# Patient Record
Sex: Male | Born: 1960 | Race: Black or African American | Hispanic: No
Health system: Southern US, Community
[De-identification: ages and names within clinical notes are randomized; demographics above are authoritative.]

## PROBLEM LIST (undated history)

## (undated) DIAGNOSIS — S3992XA Unspecified injury of lower back, initial encounter: Secondary | ICD-10-CM

## (undated) DIAGNOSIS — S199XXA Unspecified injury of neck, initial encounter: Secondary | ICD-10-CM

## (undated) DIAGNOSIS — F329 Major depressive disorder, single episode, unspecified: Secondary | ICD-10-CM

## (undated) DIAGNOSIS — G8929 Other chronic pain: Secondary | ICD-10-CM

## (undated) DIAGNOSIS — F4310 Post-traumatic stress disorder, unspecified: Secondary | ICD-10-CM

## (undated) DIAGNOSIS — M542 Cervicalgia: Secondary | ICD-10-CM

## (undated) DIAGNOSIS — R42 Dizziness and giddiness: Secondary | ICD-10-CM

## (undated) DIAGNOSIS — M549 Dorsalgia, unspecified: Secondary | ICD-10-CM

## (undated) DIAGNOSIS — F32A Depression, unspecified: Secondary | ICD-10-CM

---

## 2000-09-29 ENCOUNTER — Emergency Department (HOSPITAL_COMMUNITY): Admission: EM | Admit: 2000-09-29 | Discharge: 2000-09-30 | Payer: Self-pay | Admitting: *Deleted

## 2001-10-05 ENCOUNTER — Emergency Department (HOSPITAL_COMMUNITY): Admission: EM | Admit: 2001-10-05 | Discharge: 2001-10-05 | Payer: Self-pay | Admitting: Emergency Medicine

## 2003-07-19 ENCOUNTER — Emergency Department (HOSPITAL_COMMUNITY): Admission: EM | Admit: 2003-07-19 | Discharge: 2003-07-19 | Payer: Self-pay | Admitting: Internal Medicine

## 2003-12-09 ENCOUNTER — Emergency Department (HOSPITAL_COMMUNITY): Admission: EM | Admit: 2003-12-09 | Discharge: 2003-12-09 | Payer: Self-pay | Admitting: Emergency Medicine

## 2003-12-24 ENCOUNTER — Emergency Department (HOSPITAL_COMMUNITY): Admission: EM | Admit: 2003-12-24 | Discharge: 2003-12-24 | Payer: Self-pay | Admitting: Emergency Medicine

## 2009-05-11 ENCOUNTER — Ambulatory Visit: Payer: Self-pay | Admitting: Family Medicine

## 2009-05-11 ENCOUNTER — Telehealth (INDEPENDENT_AMBULATORY_CARE_PROVIDER_SITE_OTHER): Payer: Self-pay | Admitting: *Deleted

## 2009-05-15 ENCOUNTER — Ambulatory Visit: Payer: Self-pay | Admitting: Family Medicine

## 2010-06-04 NOTE — Progress Notes (Signed)
Summary: triage/Cold congestion  Phone Note Call from Patient   Caller: Patient Reason for Call: Talk to Nurse Summary of Call: Patient walked in at front desk.Marland KitchenMarland KitchenHe is staying at [facility] and feels he has a fever and chills...cough and congestion...given a mask for Droplet Precautions and put in Acute schedule. May need a note for rest to stay inside.. Initial call taken by: Conchita Paris,  May 11, 2009 10:08 AM

## 2011-01-10 ENCOUNTER — Emergency Department (HOSPITAL_COMMUNITY)
Admission: EM | Admit: 2011-01-10 | Discharge: 2011-01-10 | Disposition: A | Discharge status: Home / Self Care | Payer: Self-pay | Attending: Emergency Medicine | Admitting: Emergency Medicine

## 2011-01-10 ENCOUNTER — Encounter: Payer: Self-pay | Admitting: *Deleted

## 2011-01-10 ENCOUNTER — Emergency Department (HOSPITAL_COMMUNITY): Payer: Self-pay

## 2011-01-10 DIAGNOSIS — W11XXXA Fall on and from ladder, initial encounter: Secondary | ICD-10-CM | POA: Insufficient documentation

## 2011-01-10 DIAGNOSIS — S20229A Contusion of unspecified back wall of thorax, initial encounter: Secondary | ICD-10-CM | POA: Insufficient documentation

## 2011-01-10 DIAGNOSIS — Y9269 Other specified industrial and construction area as the place of occurrence of the external cause: Secondary | ICD-10-CM | POA: Insufficient documentation

## 2011-01-10 LAB — URINALYSIS, ROUTINE W REFLEX MICROSCOPIC
Glucose, UA: NEGATIVE mg/dL
Ketones, ur: NEGATIVE mg/dL
Leukocytes, UA: NEGATIVE
Protein, ur: NEGATIVE mg/dL

## 2011-01-10 MED ORDER — IBUPROFEN 800 MG PO TABS
800.0000 mg | ORAL_TABLET | Freq: Once | ORAL | Status: AC
  Administered 2011-01-10: 800 mg via ORAL
  Filled 2011-01-10: qty 1

## 2011-01-10 MED ORDER — HYDROCODONE-ACETAMINOPHEN 5-325 MG PO TABS
1.0000 | ORAL_TABLET | Freq: Once | ORAL | Status: AC
  Administered 2011-01-10: 1 via ORAL
  Filled 2011-01-10: qty 1

## 2011-01-10 MED ORDER — HYDROCODONE-ACETAMINOPHEN 5-325 MG PO TABS: ORAL_TABLET | ORAL | Status: DC

## 2011-01-10 NOTE — ED Notes (Signed)
Reports hx of chronic back pain; states fell approx 4 ft yesterday from ladder while working, landing sideways; c/o mid to lower back pain that radiates to chest, and c/o a "popping" sensation in midback; reports pain from lower back is radiating down LLE.

## 2011-01-10 NOTE — ED Provider Notes (Signed)
History     CSN: [id number] Arrival date & time: 01/10/2011  9:57 AM  Chief Complaint  Patient presents with  . Back Pain  . Fall   HPI Comments: Ethan Moore 4 feet from a ladder and landed on his back.  Patient is a 50 y.o. male presenting with back pain and fall. The history is provided by the patient. No language interpreter was used.  Back Pain  This is a new problem. The current episode started yesterday. The problem occurs constantly. The problem has been gradually worsening. The pain is associated with falling. The pain is present in the lumbar spine. The quality of the pain is described as stabbing. The pain radiates to the left thigh. The pain is at a severity of 8/10. The pain is severe. The symptoms are aggravated by bending and twisting. Associated symptoms include numbness and paresthesias. He has tried NSAIDs for the symptoms. The treatment provided no relief.  Fall Associated symptoms include numbness.    History reviewed. No pertinent past medical history.  History reviewed. No pertinent past surgical history.  No family history on file.  History  Substance Use Topics  . Smoking status: Never Smoker   . Smokeless tobacco: Not on file  . Alcohol Use: No      Review of Systems  Musculoskeletal: Positive for back pain.  Neurological: Positive for numbness and paresthesias.  All other systems reviewed and are negative.    Physical Exam  BP 127/79  Pulse 65  Temp(Src) 98 F (36.7 C) (Oral)  Resp 18  Ht 6\' 4"  (1.93 m)  Wt 255 lb (115.667 kg)  BMI 31.04 kg/m2  SpO2 100%  Physical Exam  Nursing note and vitals reviewed. Constitutional: He is oriented to person, place, and time. Vital signs are normal. He appears well-developed and well-nourished. No distress.  HENT:  Head: Normocephalic and atraumatic.  Right Ear: External ear normal.  Left Ear: External ear normal.  Nose: Nose normal.  Mouth/Throat: No oropharyngeal exudate.  Eyes: Conjunctivae and EOM are  normal. Pupils are equal, round, and reactive to light. Right eye exhibits no discharge. Left eye exhibits no discharge. No scleral icterus.  Neck: Normal range of motion. Neck supple. No JVD present. No tracheal deviation present. No thyromegaly present.  Cardiovascular: Normal rate, regular rhythm, normal heart sounds, intact distal pulses and normal pulses.  Exam reveals no gallop and no friction rub.   No murmur heard. Pulmonary/Chest: Effort normal and breath sounds normal. No stridor. No respiratory distress. He has no wheezes. He has no rales. He exhibits no tenderness.  Abdominal: Soft. Normal appearance and bowel sounds are normal. He exhibits no distension and no mass. There is no tenderness. There is no rebound and no guarding.  Musculoskeletal: Normal range of motion. He exhibits tenderness. He exhibits no edema.       Back:  Lymphadenopathy:    He has no cervical adenopathy.  Neurological: He is alert and oriented to person, place, and time. He has normal strength and normal reflexes. No cranial nerve deficit or sensory deficit. Coordination normal. GCS eye subscore is 4. GCS verbal subscore is 5. GCS motor subscore is 6.  Reflex Scores:      Patellar reflexes are 2+ on the right side and 2+ on the left side.      Achilles reflexes are 2+ on the right side and 2+ on the left side. Skin: Skin is warm and dry. No rash noted. He is not diaphoretic.  Psychiatric: He  has a normal mood and affect. His speech is normal and behavior is normal. Judgment and thought content normal. Cognition and memory are normal.    ED Course  Procedures  MDM       Worthy Rancher, PA 01/10/11 1340

## 2011-01-11 NOTE — ED Provider Notes (Signed)
Medical screening examination/treatment/procedure(s) were performed by non-physician practitioner and as supervising physician I was immediately available for consultation/collaboration.   Melva Faux M Teila Skalsky, DO 01/11/11 1444 

## 2013-03-08 ENCOUNTER — Encounter (HOSPITAL_COMMUNITY): Payer: Self-pay | Admitting: Emergency Medicine

## 2013-03-08 ENCOUNTER — Emergency Department (HOSPITAL_COMMUNITY): Payer: Non-veteran care

## 2013-03-08 ENCOUNTER — Emergency Department (HOSPITAL_COMMUNITY)
Admission: EM | Admit: 2013-03-08 | Discharge: 2013-03-08 | Discharge status: Against Medical Advice | Payer: Non-veteran care | Attending: Emergency Medicine | Admitting: Emergency Medicine

## 2013-03-08 DIAGNOSIS — Y929 Unspecified place or not applicable: Secondary | ICD-10-CM | POA: Insufficient documentation

## 2013-03-08 DIAGNOSIS — Y9389 Activity, other specified: Secondary | ICD-10-CM | POA: Insufficient documentation

## 2013-03-08 DIAGNOSIS — W19XXXA Unspecified fall, initial encounter: Secondary | ICD-10-CM

## 2013-03-08 DIAGNOSIS — Z87828 Personal history of other (healed) physical injury and trauma: Secondary | ICD-10-CM | POA: Insufficient documentation

## 2013-03-08 DIAGNOSIS — IMO0002 Reserved for concepts with insufficient information to code with codable children: Secondary | ICD-10-CM | POA: Insufficient documentation

## 2013-03-08 DIAGNOSIS — M549 Dorsalgia, unspecified: Secondary | ICD-10-CM

## 2013-03-08 DIAGNOSIS — R29898 Other symptoms and signs involving the musculoskeletal system: Secondary | ICD-10-CM

## 2013-03-08 DIAGNOSIS — W11XXXA Fall on and from ladder, initial encounter: Secondary | ICD-10-CM | POA: Insufficient documentation

## 2013-03-08 HISTORY — DX: Unspecified injury of lower back, initial encounter: S39.92XA

## 2013-03-08 LAB — BASIC METABOLIC PANEL
Calcium: 8.6 mg/dL (ref 8.4–10.5)
Creatinine, Ser: 1.38 mg/dL — ABNORMAL HIGH (ref 0.50–1.35)
GFR calc Af Amer: 67 mL/min — ABNORMAL LOW (ref >90)
GFR calc non Af Amer: 58 mL/min — ABNORMAL LOW (ref >90)

## 2013-03-08 LAB — URINALYSIS, ROUTINE W REFLEX MICROSCOPIC
Ketones, ur: NEGATIVE mg/dL
Leukocytes, UA: NEGATIVE
Nitrite: NEGATIVE
Protein, ur: NEGATIVE mg/dL

## 2013-03-08 LAB — CBC WITH DIFFERENTIAL/PLATELET
Basophils Absolute: 0.1 10*3/uL (ref 0.0–0.1)
Basophils Relative: 1 % (ref 0–1)
Eosinophils Relative: 4 % (ref 0–5)
HCT: 41 % (ref 39.0–52.0)
MCHC: 33.7 g/dL (ref 30.0–36.0)
MCV: 92.3 fL (ref 78.0–100.0)
Monocytes Absolute: 0.7 10*3/uL (ref 0.1–1.0)
Neutro Abs: 5.9 10*3/uL (ref 1.7–7.7)
RDW: 13.4 % (ref 11.5–15.5)

## 2013-03-08 MED ORDER — ONDANSETRON HCL 4 MG/2ML IJ SOLN
4.0000 mg | Freq: Once | INTRAMUSCULAR | Status: DC
  Filled 2013-03-08: qty 2

## 2013-03-08 MED ORDER — IOHEXOL 300 MG/ML  SOLN
100.0000 mL | Freq: Once | INTRAMUSCULAR | Status: AC | PRN
  Administered 2013-03-08: 100 mL via INTRAVENOUS

## 2013-03-08 MED ORDER — MORPHINE SULFATE 4 MG/ML IJ SOLN
4.0000 mg | Freq: Once | INTRAMUSCULAR | Status: DC
  Filled 2013-03-08: qty 1

## 2013-03-08 MED ORDER — IBUPROFEN 800 MG PO TABS: 800.0000 mg | ORAL_TABLET | Freq: Three times a day (TID) | ORAL | Status: DC

## 2013-03-08 MED ORDER — OXYCODONE-ACETAMINOPHEN 5-325 MG PO TABS
2.0000 | ORAL_TABLET | Freq: Once | ORAL | Status: AC
  Administered 2013-03-08: 2 via ORAL
  Filled 2013-03-08: qty 2

## 2013-03-08 MED ORDER — HYDROCODONE-ACETAMINOPHEN 5-325 MG PO TABS: 2.0000 | ORAL_TABLET | ORAL | Status: DC | PRN

## 2013-03-08 MED ORDER — OXYCODONE-ACETAMINOPHEN 5-325 MG PO TABS: 2.0000 | ORAL_TABLET | ORAL | Status: DC | PRN

## 2013-03-08 NOTE — ED Notes (Signed)
Pt went over to MRI and was not tolerating scan and refused scan. MRI staff cancelled order for scan. EDP aware no new orders given.

## 2013-03-08 NOTE — ED Notes (Signed)
Pt states he was cleaning gutters and Saturday and fell off the ladder. Complain of pain in chest, back and left leg. States he was knock out for a while

## 2013-03-08 NOTE — ED Notes (Signed)
Pt. Tolerated ambulation well.

## 2013-03-08 NOTE — ED Notes (Addendum)
Pt refusing IV and morphine for pain. EDP aware.

## 2013-03-08 NOTE — ED Provider Notes (Signed)
CSN: [id number]     Arrival date & time 03/08/13  0919 History   This chart was scribed for Glynn Octave, MD, by Yevette Edwards, ED Scribe. This patient was seen in room APA16A/APA16A and the patient's care was started at 9:46 AM.  First MD Initiated Contact with Patient 03/08/13 0945     Chief Complaint  Patient presents with  . Fall   The history is provided by the patient. No language interpreter was used.   HPI Comments: Ethan Moore is a 52 y.o. male who presents to the Emergency Department complaining of a fall which occurred three days ago when he fell off of an 8 foot ladder and landed upon his back and left hip. The pt denies any head impact or LOC in the fall. He reports pain to his lower back, left hip, and he states the pain "shoots" down his left leg. He also reports weakness to his left leg. The pt states he has not been ambulating much since the fall. He denies any chest pain, emesis, dysuria, or hematuria. He reports he is experiencing pain to his lower sternum. He denies any h/o cardiac issues.m The pt has a h/o back issues.  The pt denies any known allergies.  He utilizes the [facility] for medical treatment. He used to receive steroid injections at the [facility] to his back.  Past Medical History  Diagnosis Date  . Back injury    History reviewed. No pertinent past surgical history. History reviewed. No pertinent family history. History  Substance Use Topics  . Smoking status: Never Smoker   . Smokeless tobacco: Not on file  . Alcohol Use: No    Review of Systems  Gastrointestinal: Negative for vomiting.  Genitourinary: Negative for dysuria and hematuria.  Musculoskeletal: Positive for back pain. Negative for neck pain.  All other systems reviewed and are negative.   Allergies  Review of patient's allergies indicates no known allergies.  Home Medications   Current Outpatient Rx  Name  Route  Sig  Dispense  Refill  . Multiple Vitamins-Minerals (MULTIVITAMINS THER.  W/MINERALS) TABS   Oral   Take 1 tablet by mouth daily.           Marland Kitchen HYDROcodone-acetaminophen (NORCO/VICODIN) 5-325 MG per tablet   Oral   Take 1 tablet by mouth every 6 (six) hours as needed for moderate pain.         Marland Kitchen ibuprofen (ADVIL,MOTRIN) 800 MG tablet   Oral   Take 1 tablet (800 mg total) by mouth 3 (three) times daily.   21 tablet   0   . oxyCODONE-acetaminophen (PERCOCET/ROXICET) 5-325 MG per tablet   Oral   Take 2 tablets by mouth every 4 (four) hours as needed for severe pain.   15 tablet   0    Triage Vitals: BP 125/76  Pulse 83  Temp(Src) 98.6 F (37 C) (Oral)  Resp 18  Ht 6\' 4"  (1.93 m)  Wt 260 lb (117.935 kg)  BMI 31.66 kg/m2  SpO2 99%  Physical Exam  Nursing note and vitals reviewed. Constitutional: He is oriented to person, place, and time. He appears well-developed and well-nourished. No distress.  HENT:  Head: Normocephalic and atraumatic.  Eyes: EOM are normal.  Neck: Neck supple. No tracheal deviation present.  Cardiovascular: Normal rate.   Pulmonary/Chest: Effort normal. No respiratory distress.  Abdominal: There is tenderness.  LUQ tenderness.   Musculoskeletal: Normal range of motion. He exhibits tenderness.  Tender in lumbar spine.  No C-spine tenderness.  No step-off or deformity.  Pelvis stable.  Tender to lower sternum.  4/5 strength in extension and flexion in the left. 5/5 extension and flexion in the right.  Ankle plantar and dorsiflexion intact.  Weak great toe extension on the left.  +2 DP and PT pulses. +2 patellar reflexes bilaterally. Normal gait.   Neurological: He is alert and oriented to person, place, and time. He displays normal reflexes. No cranial nerve deficit. He exhibits normal muscle tone. Coordination normal.  CN 2-12 intact, no ataxia on finger to nose, no nystagmus, 5/5 strength throughout, no pronator drift, Romberg negative, normal gait.   Skin: Skin is warm and dry.  Psychiatric: He has a normal mood  and affect. His behavior is normal.    ED Course  Procedures (including critical care time)  DIAGNOSTIC STUDIES: Oxygen Saturation is 99% on room air, normal by my interpretation.    COORDINATION OF CARE:  9:53 AM- Discussed treatment plan with patient, and the patient agreed to the plan.   11:54 AM- Rechecked pt.   1:11 PM- Rechecked pt.   Labs Review Labs Reviewed  BASIC METABOLIC PANEL - Abnormal; Notable for the following:    Creatinine, Ser 1.38 (*)    GFR calc non Af Amer 58 (*)    GFR calc Af Amer 67 (*)    All other components within normal limits  CBC WITH DIFFERENTIAL  URINALYSIS, ROUTINE W REFLEX MICROSCOPIC  TROPONIN I   Imaging Review Dg Chest 2 View  03/08/2013   CLINICAL DATA:  Chest pain. Fall  EXAM: CHEST  2 VIEW  COMPARISON:  None.  FINDINGS: The heart size and mediastinal contours are within normal limits. Both lungs are clear. The visualized skeletal structures are unremarkable.  IMPRESSION: No active cardiopulmonary disease.   Electronically Signed   By: Marlan Palau M.D.   On: 03/08/2013 10:49   Dg Lumbar Spine Complete  03/08/2013   CLINICAL DATA:  Back pain  EXAM: LUMBAR SPINE - COMPLETE 4+ VIEW  COMPARISON:  None.  FINDINGS: Vertebral body height is well maintained. No spondylolysis or spondylolisthesis is seen. No acute soft tissue abnormality is noted.  IMPRESSION: No acute abnormality noted.   Electronically Signed   By: Alcide Clever M.D.   On: 03/08/2013 10:49   Ct Head Wo Contrast  03/08/2013   CLINICAL DATA:  Fall 3 days ago  EXAM: CT HEAD WITHOUT CONTRAST  TECHNIQUE: Contiguous axial images were obtained from the base of the skull through the vertex without intravenous contrast.  COMPARISON:  None.  FINDINGS: No skull fracture is noted. Minimal scalp swelling in right posterior parietal scalp see axial image 14.  No intracranial hemorrhage, mass effect or midline shift.  No acute infarction. No hydrocephalus. The gray and white-matter  differentiation is preserved. No mass lesion is noted on this unenhanced scan.  IMPRESSION: No acute intracranial abnormality.   Electronically Signed   By: Natasha Mead M.D.   On: 03/08/2013 11:07   Ct Abdomen Pelvis W Contrast  03/08/2013   CLINICAL DATA:  Fall 3 days ago, back pain, left hip pain  EXAM: CT ABDOMEN AND PELVIS WITH CONTRAST  TECHNIQUE: Multidetector CT imaging of the abdomen and pelvis was performed using the standard protocol following bolus administration of intravenous contrast.  CONTRAST:  OMNIPAQUE IOHEXOL 300 MG/ML  SOLN  COMPARISON:  None.  FINDINGS: Sagittal images of the spine shows disc space flattening with vacuum disc phenomenon and mild anterior spurring  at L5-S1 level. Mild degenerative changes bilateral SI joints. No acute fractures are identified.  Lung bases are unremarkable. Enhanced liver, pancreas, spleen and adrenals are unremarkable. Enhanced kidneys are symmetrical in size. No calcified gallstones are noted within gallbladder. No aortic aneurysm. No small bowel obstruction. No thickened or dilated small bowel loops. No pericecal inflammation. Normal appendix is clearly visualize in axial image 75. No evidence of urinary bladder injury. Prostate gland and seminal vesicles are unremarkable. No inguinal adenopathy. No pelvic fractures are noted. Coronal images shows no hip fracture.  No ascites or free air. No adenopathy.  IMPRESSION: 1. No acute visceral injury within abdomen or pelvis. 2. No hydronephrosis or hydroureter. 3. No pericecal inflammation. Normal appendix. 4. No ascites or free air. 5. Degenerative changes lumbar spine at L5-S1 level.   Electronically Signed   By: Natasha Mead M.D.   On: 03/08/2013 11:03    EKG Interpretation     Ventricular Rate:  67 PR Interval:  212 QRS Duration: 96 QT Interval:  410 QTC Calculation: 433 R Axis:   91 Text Interpretation:  Sinus rhythm with 1st degree A-V block Rightward axis Borderline ECG No previous ECGs  available No previous ECGs available            MDM   1. Fall, initial encounter   2. Back pain   3. Leg weakness    Fall from 8 feet 3 days ago with back, abdominal pain and head pain.  Uncertain LOC.  L leg weakness and back pain today. No incontinence.  CT head and CT abdomen negative. L leg weakness persists but patient is able to ambulate. MRI of lumbar spine was ordered to evaluate for possible spinal cord injury.  Patient attempted study but was unable to tolerate 2/2 claustrophobia.  He declines medication or reattempt of MRI. He is ambulatory and wants to go home.  He understands that a spinal cord injury has not been ruled out and he is leaving against medical advice. He has capacity to make this decision and understands that he might develop worsening weakness, incontinence, impotence, paralysis, or even death.  I personally performed the services described in this documentation, which was scribed in my presence. The recorded information has been reviewed and is accurate.   Glynn Octave, MD 03/08/13 [phone]

## 2013-03-28 ENCOUNTER — Encounter (HOSPITAL_COMMUNITY): Payer: Self-pay | Admitting: Emergency Medicine

## 2013-03-28 ENCOUNTER — Emergency Department (HOSPITAL_COMMUNITY)
Admission: EM | Admit: 2013-03-28 | Discharge: 2013-03-28 | Disposition: A | Discharge status: Home / Self Care | Payer: Non-veteran care | Attending: Emergency Medicine | Admitting: Emergency Medicine

## 2013-03-28 DIAGNOSIS — IMO0002 Reserved for concepts with insufficient information to code with codable children: Secondary | ICD-10-CM | POA: Insufficient documentation

## 2013-03-28 DIAGNOSIS — Y9289 Other specified places as the place of occurrence of the external cause: Secondary | ICD-10-CM | POA: Insufficient documentation

## 2013-03-28 DIAGNOSIS — S8990XA Unspecified injury of unspecified lower leg, initial encounter: Secondary | ICD-10-CM | POA: Insufficient documentation

## 2013-03-28 DIAGNOSIS — X500XXA Overexertion from strenuous movement or load, initial encounter: Secondary | ICD-10-CM | POA: Insufficient documentation

## 2013-03-28 DIAGNOSIS — Y9389 Activity, other specified: Secondary | ICD-10-CM | POA: Insufficient documentation

## 2013-03-28 DIAGNOSIS — Z791 Long term (current) use of non-steroidal anti-inflammatories (NSAID): Secondary | ICD-10-CM | POA: Insufficient documentation

## 2013-03-28 DIAGNOSIS — M549 Dorsalgia, unspecified: Secondary | ICD-10-CM

## 2013-03-28 DIAGNOSIS — T148XXA Other injury of unspecified body region, initial encounter: Secondary | ICD-10-CM

## 2013-03-28 MED ORDER — DICLOFENAC SODIUM 75 MG PO TBEC: 75.0000 mg | DELAYED_RELEASE_TABLET | Freq: Two times a day (BID) | ORAL | Status: DC

## 2013-03-28 MED ORDER — DEXAMETHASONE 6 MG PO TABS: ORAL_TABLET | ORAL | Status: DC

## 2013-03-28 MED ORDER — DIAZEPAM 5 MG PO TABS: ORAL_TABLET | ORAL | Status: DC

## 2013-03-28 NOTE — ED Notes (Signed)
Patient is requesting pain medication, however he is driving.  Advised Rx's being given which he can fill and then take when he gets home- cannot take valium and drive.

## 2013-03-28 NOTE — ED Provider Notes (Signed)
Medical screening examination/treatment/procedure(s) were performed by non-physician practitioner and as supervising physician I was immediately available for consultation/collaboration.     Geoffery Lyons, MD 03/28/13 [phone]

## 2013-03-28 NOTE — ED Notes (Signed)
Back pain after helping lift a wood stove this morning.

## 2013-03-28 NOTE — ED Provider Notes (Signed)
CSN: [id number]     Arrival date & time 03/28/13  1856 History   First MD Initiated Contact with Patient 03/28/13 1930     Chief Complaint  Patient presents with  . Back Pain   (Consider location/radiation/quality/duration/timing/severity/associated sxs/prior Treatment) HPI Comments: Patient is a 52 year old male who has a history of" back injury". He is seen primarily at the [facility] for evaluation of his back. The patient states he has had multiple studies including a myelogram and so far the answer to why he is having some back problems has not been answered.  Today the patient was assisting someone in moving a wood stove, and he twisted his knee and lower back area. The patient states he has been having lower back problems for quite some time, but now has some mid back problem. There's been no loss of bowel bladder function. The patient has been able to ambulate however with some pain and soreness.  Patient is a 52 y.o. male presenting with back pain. The history is provided by the patient.  Back Pain Associated symptoms: no abdominal pain, no chest pain and no dysuria     Past Medical History  Diagnosis Date  . Back injury    History reviewed. No pertinent past surgical history. History reviewed. No pertinent family history. History  Substance Use Topics  . Smoking status: Never Smoker   . Smokeless tobacco: Not on file  . Alcohol Use: No    Review of Systems  Constitutional: Negative for activity change.       All ROS Neg except as noted in HPI  HENT: Negative for nosebleeds.   Eyes: Negative for photophobia and discharge.  Respiratory: Negative for cough, shortness of breath and wheezing.   Cardiovascular: Negative for chest pain and palpitations.  Gastrointestinal: Negative for abdominal pain and blood in stool.  Genitourinary: Negative for dysuria, frequency and hematuria.  Musculoskeletal: Positive for back pain. Negative for arthralgias and  neck pain.  Skin: Negative.   Neurological: Negative for dizziness, seizures and speech difficulty.  Psychiatric/Behavioral: Negative for hallucinations and confusion.    Allergies  Review of patient's allergies indicates no known allergies.  Home Medications   Current Outpatient Rx  Name  Route  Sig  Dispense  Refill  . acetaminophen (TYLENOL) 500 MG tablet   Oral   Take 500 mg by mouth every 6 (six) hours as needed.         Marland Kitchen ibuprofen (ADVIL,MOTRIN) 200 MG tablet   Oral   Take 200 mg by mouth every 6 (six) hours as needed.         . Multiple Vitamins-Minerals (MULTIVITAMINS THER. W/MINERALS) TABS   Oral   Take 1 tablet by mouth daily.           Marland Kitchen ibuprofen (ADVIL,MOTRIN) 800 MG tablet   Oral   Take 1 tablet (800 mg total) by mouth 3 (three) times daily.   21 tablet   0   . oxyCODONE-acetaminophen (PERCOCET/ROXICET) 5-325 MG per tablet   Oral   Take 2 tablets by mouth every 4 (four) hours as needed for severe pain.   15 tablet   0    BP 137/89  Pulse 100  Temp(Src) 98.7 F (37.1 C) (Oral)  Resp 20  Ht 6\' 4"  (1.93 m)  Wt 250 lb (113.399 kg)  BMI 30.44 kg/m2  SpO2 98% Physical Exam  Nursing note and vitals reviewed. Constitutional: He is oriented to person, place, and time. He appears well-developed  and well-nourished.  Non-toxic appearance.  HENT:  Head: Normocephalic.  Right Ear: Tympanic membrane and external ear normal.  Left Ear: Tympanic membrane and external ear normal.  Eyes: EOM and lids are normal. Pupils are equal, round, and reactive to light.  Neck: Normal range of motion. Neck supple. Carotid bruit is not present.  Cardiovascular: Normal rate, regular rhythm, normal heart sounds, intact distal pulses and normal pulses.   Pulmonary/Chest: Breath sounds normal. No respiratory distress.  Abdominal: Soft. Bowel sounds are normal. There is no tenderness. There is no guarding.  Musculoskeletal: Normal range of motion.  There is pain and spasm  to palpation and to range of motion of the mid thoracic area as well as to the lumbar region. No palpable step off appreciated. No hot areas appreciated.  Lymphadenopathy:       Head (right side): No submandibular adenopathy present.       Head (left side): No submandibular adenopathy present.    He has no cervical adenopathy.  Neurological: He is alert and oriented to person, place, and time. He has normal strength. No cranial nerve deficit or sensory deficit.  Gait is steady. There is no foot drop appreciated. No sensory deficits appreciated of the lower extremities.  Skin: Skin is warm and dry.  Psychiatric: He has a normal mood and affect. His speech is normal.    ED Course  Procedures (including critical care time) Labs Review Labs Reviewed - No data to display Imaging Review No results found.  EKG Interpretation   None       MDM  No diagnosis found. *I have reviewed nursing notes, vital signs, and all appropriate lab and imaging results for this patient.**  Patient was assisting 3 other people in lifting a wood stove when he twisted his back. It is of note the patient has had problems with his back in the past. Patient is scheduled to see a physician at the [facility] next week, but presented to the emergency department tonight because of the recent injury of his back.  No gross neurologic deficit appreciated on examination at this time. There is noted some mild spasm present. The plan at this time is for the patient to use Valium, and  and Decadron and diclofenac. Patient is to alternate heat and ice to his back.  Kathie Dike, PA-C 03/28/13 2007  Kathie Dike, PA-C 03/28/13 2008

## 2013-03-28 NOTE — ED Notes (Signed)
When reviewing medications with patient he asked if we could give him the same medicine we gave him on visit 03/08/13   States he knows that medicine will work (looked up and he received Rx for [employer])   Advised him I will inform PA.    I returned to inform patient that PA has prescribed what he thinks will help for his condition and if he needs specific oxycodone/percocet he will need to be seen at [facility] and obtain Rx from them.  Patient left dissatisfied but understood

## 2014-01-09 ENCOUNTER — Emergency Department (HOSPITAL_COMMUNITY)
Admission: EM | Admit: 2014-01-09 | Discharge: 2014-01-09 | Disposition: A | Discharge status: Home / Self Care | Payer: Non-veteran care | Attending: Emergency Medicine | Admitting: Emergency Medicine

## 2014-01-09 ENCOUNTER — Encounter (HOSPITAL_COMMUNITY): Payer: Self-pay | Admitting: Emergency Medicine

## 2014-01-09 DIAGNOSIS — Z791 Long term (current) use of non-steroidal anti-inflammatories (NSAID): Secondary | ICD-10-CM | POA: Diagnosis not present

## 2014-01-09 DIAGNOSIS — G8929 Other chronic pain: Secondary | ICD-10-CM | POA: Diagnosis not present

## 2014-01-09 DIAGNOSIS — Z87828 Personal history of other (healed) physical injury and trauma: Secondary | ICD-10-CM | POA: Diagnosis not present

## 2014-01-09 DIAGNOSIS — J069 Acute upper respiratory infection, unspecified: Secondary | ICD-10-CM | POA: Diagnosis not present

## 2014-01-09 DIAGNOSIS — H81399 Other peripheral vertigo, unspecified ear: Secondary | ICD-10-CM | POA: Diagnosis not present

## 2014-01-09 DIAGNOSIS — Z79899 Other long term (current) drug therapy: Secondary | ICD-10-CM | POA: Insufficient documentation

## 2014-01-09 DIAGNOSIS — J3489 Other specified disorders of nose and nasal sinuses: Secondary | ICD-10-CM | POA: Diagnosis present

## 2014-01-09 DIAGNOSIS — IMO0002 Reserved for concepts with insufficient information to code with codable children: Secondary | ICD-10-CM | POA: Insufficient documentation

## 2014-01-09 HISTORY — DX: Unspecified injury of neck, initial encounter: S19.9XXA

## 2014-01-09 HISTORY — DX: Dorsalgia, unspecified: M54.9

## 2014-01-09 HISTORY — DX: Cervicalgia: M54.2

## 2014-01-09 HISTORY — DX: Other chronic pain: G89.29

## 2014-01-09 HISTORY — DX: Dizziness and giddiness: R42

## 2014-01-09 MED ORDER — MECLIZINE HCL 25 MG PO TABS: 25.0000 mg | ORAL_TABLET | Freq: Three times a day (TID) | ORAL | Status: DC | PRN

## 2014-01-09 NOTE — Discharge Instructions (Signed)
°Emergency Department Resource Guide °1) Find a Doctor and Pay Out of Pocket °Although you won't have to find out who is covered by your insurance plan, it is a good idea to ask around and get recommendations. You will then need to call the office and see if the doctor you have chosen will accept you as a new patient and what types of options they offer for patients who are self-pay. Some doctors offer discounts or will set up payment plans for their patients who do not have insurance, but you will need to ask so you aren't surprised when you get to your appointment. ° °2) Contact Your Local Health Department °Not all health departments have doctors that can see patients for sick visits, but many do, so it is worth a call to see if yours does. If you don't know where your local health department is, you can check in your phone book. The CDC also has a tool to help you locate your state's health department, and many state websites also have listings of all of their local health departments. ° °3) Find a Walk-in Clinic °If your illness is not likely to be very severe or complicated, you may want to try a walk in clinic. These are popping up all over the country in pharmacies, drugstores, and shopping centers. They're usually staffed by nurse practitioners or physician assistants that have been trained to treat common illnesses and complaints. They're usually fairly quick and inexpensive. However, if you have serious medical issues or chronic medical problems, these are probably not your best option. ° °No Primary Care Doctor: °- Call [facility] at  832-8000 - they can help you locate a primary care doctor that  accepts your insurance, provides certain services, etc. °- Physician Referral Service- 1-800-533-3463 ° °Chronic Pain Problems: °Organization         Address  Phone   Notes  °[facility]  (336) 297-2271 Patients need to be referred by their primary care doctor.  ° °Medication  Assistance: °Organization         Address  Phone   Notes  °[place] [facility] [address] °[place], [place] [address] (336) 641-8030 --Must be a resident of [place] °-- Must have NO insurance coverage whatsoever (no Medicaid/ Medicare, etc.) °-- The pt. MUST have a primary care doctor that directs their care regularly and follows them in the community °  °[facility]  (866) 331-1348   °[employer]  (888) 892-1162   ° °Agencies that provide inexpensive medical care: °Organization         Address  Phone   Notes  °[facility]  (336) 832-8035   °[facility]    (336) 832-7272   °[facility] [address] °[place], [place] [address] (336) 832-4777   °[facility] [address], °[place] (336) 271-4999   °[facility]    (336) 373-0678   °[facility]    (336) 272-1050   °[facility] ° [address], [place] Phone:  (336) 832-4444, Fax:  (336) 832-4440 Hours of Operation:  9 am - 6 pm, M-F.  Also accepts Medicaid/Medicare and self-pay.  °[facility] ° [address], [place] Phone: (336) 832-3150, Fax: (336) [phone]. Hours of Operation:  8:30 am - 5:30 pm, M-F.  Also accepts Medicaid and self-pay.  °[facility] [place] [address], [place] Phone: (336) 878-6027   °[facility] [address], [place], [place] (336)723-1848, Ext. 123 Mondays & Thursdays: 7-9 AM.  First 15 patients are seen on a first come, first serve basis. °  ° °Medicaid-accepting [place] Providers: ° °Organization         Address  Phone   Notes  °[facility] [address], [place] (336) 641-2100 Also accepts self-pay patients.  °[facility] [address], [place] ° (336) 856-9996   °[facility] [address], [place]  (336) 288-8857   °[facility] [address] [place] [address], [place] (336) 299-7000   °Veita Bland [address], [place]  ° (336) 373-1557 Only accepts [place] Access Medicaid patients after they have their name applied to their card.  ° °Self-Pay (no insurance) in [place]: ° °Organization         Address  Phone   Notes  °Sickle Cell Patients, [facility] [address], [place] (336) 832-1970   °[facility] [address], [place] (336) 832-4400   °[facility] ° [address], [place] (336) 992-4800   °[facility]/Dr. Osei-Bonsu ° [address] [place] [address], [place] or [address], [place] (336) [phone] Phone number for both [place] and [place] locations is the same.  °[facility] [address], [place] (336) 299-0000   °[facility] [address] [place] [address], [place] or [address] (336) 852-7530 °(336) 878-2260   °[facility] [address], [place] (336) 350-1642, phone; (336) 294-5005, fax Sees patients 1st and 3rd Saturday of every month.  Must not qualify for public or private insurance (i.e. Medicaid, Medicare, [place] Health Choice, Veterans' Benefits) • Household income should be no more than 200% of the poverty level •The clinic cannot treat you if you are pregnant or think you are pregnant • Sexually transmitted diseases are not treated at the clinic.  ° ° °Dental Care: °Organization         Address  Phone  Notes  °[place] [facility] [address], [place] (336) 641-6152 Accepts children up to age 21 who are enrolled in Medicaid or [place] Health Choice; pregnant women with a Medicaid card; and children who have applied for Medicaid or [place] Health Choice, but were declined, whose parents can pay a reduced fee at time of service.  °[place]  [facility] [place]  [address], [place] (336) 641-7733 Accepts children up to age 21 who are enrolled in Medicaid or [place] Health Choice; pregnant women with a Medicaid card; and children who have applied for Medicaid or [place] Health Choice, but were declined, whose parents can pay a reduced fee at time of service.  °[facility] ° [address], [place] (336) 641-4533 Patients are seen by appointment only. Walk-ins are not accepted. [facility] will see patients 18 years of age and older. °Monday - Tuesday (8am-5pm) °Most Wednesdays (8:30-5pm) °$30 per visit, cash only  °[facility] ° [address], [place] (336) 641-4533 Patients are seen by appointment only. Walk-ins are not accepted. [facility] will see patients 18 years of age and older. °One   Wednesday Evening (Monthly: Volunteer Based).  $30 per visit, cash only  °[facility]  (919) 537-3737 for adults; Children under age 4, call Graduate Pediatric Dentistry at (919) [phone]. Children aged 4-14, please call ([phone]) [phone] to request a pediatric application. ° Dental services are provided in all areas of dental care including fillings, crowns and bridges, complete and partial dentures, implants, gum treatment, root canals, and extractions. Preventive care is also provided. Treatment is provided to both adults and children. °Patients are selected via a lottery and there is often a waiting list. °  °[facility] [address], °[place] ° (336) 763-8833 [email or web address] °  °[facility] [address], [place], [place] (336)723-1848, Ext. 123 Second and Fourth Thursday of each month, opens at 6:30 AM; Clinic ends at 9 AM.  Patients are seen on a first-come first-served basis, and a limited number are seen during each clinic.  ° °[facility] ° [address], [place], [place] (336) 723-7904    Eligibility Requirements °You must have lived in [place], or [place] counties for at least the last three months. °  You cannot be eligible for state or federal sponsored healthcare insurance, including Veterans Administration, Medicaid, or Medicare. °  You generally cannot be eligible for healthcare insurance through your employer.  °  How to apply: °Eligibility screenings are held every Tuesday and Wednesday afternoon from 1:00 pm until 4:00 pm. You do not need an appointment for the interview!  °[facility] [address], [place], [place] 336-631-2330   °[facility]  336-342-8273   °[facility]  336-703-3100   °[facility]  336-570-6415   ° °Behavioral Health Resources in the Community: °Intensive Outpatient Programs °Organization         Address  Phone  Notes  °[facility] [address], [place], [place] 336-878-6098   °[facility] [address], [place], [place] 336-832-9800   °[facility] [address], [place], [place] ° 336-882-2125   °[facility] [address],  °[place], [place] 1-800-853-5163 or 336-641-4981   °Substance Abuse Resources °Organization         Address  Phone  Notes  °[facility]  336-882-2125   °[facility]  336-784-9470   °[facility]  336-285-9073   °[facility]  336-845-3988   °Residential & Outpatient Substance Abuse Program  1-800-659-3381   °Psychological Services °Organization         Address  Phone  Notes  °[facility]  336- 832-9600   °[facility]  336- 378-7881   °[facility] [address], [place] 1-800-853-5163 or 336-641-4981   ° °Mobile Crisis Teams °Organization         Address  Phone  Notes  °[facility], Mobile Crisis Care Unit  1-877-626-1772   °Assertive °[facility] ° [address]  [place], [place] 336-834-9664   °Sharon DeEsch [address] °[place] 336-554-5454   ° °Self-Help/Support Groups °Organization         Address  Phone             Notes  °[facility]  - variety of support groups  336- 373-1402 Call for more information  °Narcotics Anonymous (NA), [facility] [address], °[place]  2 meetings at this location  ° °  Residential Treatment Programs Organization         Address  Phone  Notes  [facility] [address],    [place]  1-610-960-4540   [facility]  [address] [place] [address], [place], [place] [phone]   [facility] [address] [place], [place] 086-578-4696 Admissions: 8am-3pm M-F  [facility] [address],    [place], [place] 295-284-1324   [facility] [address] [place], [place], [place] 401-027-2536   [facility] [address]  [place], [place] [phone]   [facility] - Intensive Outpatient [address], [place], [place] 956-387-5643   [facility] ([facility] [address] [place]  [place], [place] 3-295-188-4166 or (585)228-5658   [facility] [address], [place], [place] 323-557-3220 Accepts Medicaid  [facility] [address],  [place] 2-542-706-2376 Substance Abuse/Addiction Treatment   [facility]         Address  Phone  Notes  [facility]  (972)486-0814   Angie Fava, PhD [address] [place], [place]   3108690583 or 607-764-7984   [facility]   [address] [place], [place] 415-144-3665   [facility] [address] [place], [place] 743-659-0926 Insurance/Medicaid/sponsorship through [facility] and Families [address]                                    [place], [place] (970)[phone] Therapy/tele-psych/case    [facility] [address], [place] [phone]    Dr. Lolly Mustache  5805279366   [facility]  [employer] [facility] Dept. 1) [address], [place] 2) [address], [place] 3)  [address], [place] [phone]  (702) [phone]   [facility] Child Abuse Hotline 318 458 0776 or [phone] (After Hours)       Take over the counter decongestant (such as sudafed), as well as an antihistamine (such as claritin or zyrtec), as directed on packaging, for the next 2 to 3 weeks.  Use over the counter normal saline nasal spray, as instructed in the Emergency Department, several times per day for the next 3 weeks.  Call your regular medical doctor tomorrow to schedule a follow up appointment in the next 2 days.  Return to the Emergency Department immediately if worsening.

## 2014-01-09 NOTE — ED Notes (Signed)
Pt d/c with stable ambulation

## 2014-01-09 NOTE — ED Provider Notes (Signed)
CSN: [id number]     Arrival date & time 01/09/14  4696 History   First MD Initiated Contact with Patient 01/09/14 [id number]     Chief Complaint  Patient presents with  . URI      HPI Pt was seen at 0800.  Per pt, c/o gradual onset and persistence of constant runny/stuffy nose, sinus and ears congestion for the past 1 to 2 weeks. States his "ears feel full."  Has been associated with a "spinning" sensation when he changes position. Denies fevers, no rash, no sore throat, no CP/SOB, no N/V/D, no abd pain, no hearing loss, no visual changes, no focal motor weakness, no tingling/numbness in extremities, no syncope/near syncope.    Past Medical History  Diagnosis Date  . Back injury   . Neck injury   . Chronic back pain   . Chronic neck pain   . Dizziness    History reviewed. No pertinent past surgical history.  History  Substance Use Topics  . Smoking status: Never Smoker   . Smokeless tobacco: Not on file  . Alcohol Use: No    Review of Systems ROS: Statement: All systems negative except as marked or noted in the HPI; Constitutional: Negative for fever and chills. ; ; Eyes: Negative for eye pain, redness and discharge. ; ; ENMT: Negative for ear pain, hoarseness, sore throat.+nasal congestion, sinus pressure. ; ; Cardiovascular: Negative for chest pain, palpitations, diaphoresis, dyspnea and peripheral edema. ; ; Respiratory: Negative for cough, wheezing and stridor. ; ; Gastrointestinal: Negative for nausea, vomiting, diarrhea, abdominal pain, blood in stool, hematemesis, jaundice and rectal bleeding. . ; ; Genitourinary: Negative for dysuria, flank pain and hematuria. ; ; Musculoskeletal: Negative for back pain and neck pain. Negative for swelling and trauma.; ; Skin: Negative for pruritus, rash, abrasions, blisters, bruising and skin lesion.; ; Neuro: +"spinning." Negative for headache, lightheadedness and neck stiffness. Negative for weakness, altered level of consciousness , altered mental  status, extremity weakness, paresthesias, involuntary movement, seizure and syncope.     Allergies  Review of patient's allergies indicates no known allergies.  Home Medications   Prior to Admission medications   Medication Sig Start Date End Date Taking? Authorizing Provider  acetaminophen (TYLENOL) 500 MG tablet Take 500 mg by mouth every 6 (six) hours as needed.    Historical Provider, MD  dexamethasone (DECADRON) 6 MG tablet 1 by mouth twice a day with food. 03/28/13   Kathie Dike, PA-C  diazepam (VALIUM) 5 MG tablet 1 po tid for spasm pain 03/28/13   Kathie Dike, PA-C  diclofenac (VOLTAREN) 75 MG EC tablet Take 1 tablet (75 mg total) by mouth 2 (two) times daily. 03/28/13   Kathie Dike, PA-C  ibuprofen (ADVIL,MOTRIN) 200 MG tablet Take 200 mg by mouth every 6 (six) hours as needed.    Historical Provider, MD  ibuprofen (ADVIL,MOTRIN) 800 MG tablet Take 1 tablet (800 mg total) by mouth 3 (three) times daily. 03/08/13   Glynn Octave, MD  Multiple Vitamins-Minerals (MULTIVITAMINS THER. W/MINERALS) TABS Take 1 tablet by mouth daily.      Historical Provider, MD  oxyCODONE-acetaminophen (PERCOCET/ROXICET) 5-325 MG per tablet Take 2 tablets by mouth every 4 (four) hours as needed for severe pain. 03/08/13   Glynn Octave, MD   BP 124/86  Pulse 63  Temp(Src) 98.4 F (36.9 C) (Oral)  Resp 18  Ht  (1.93 m)  Wt 250 lb (113.399 kg)  BMI 30.44 kg/m2  SpO2 99% Physical Exam  0805: Physical examination:  Nursing notes reviewed; Vital signs and O2 SAT reviewed;  Constitutional: Well developed, Well nourished, Well hydrated, In no acute distress; Head:  Normocephalic, atraumatic; Eyes: EOMI, PERRL, No scleral icterus; ENMT: +clear fluid behind TM's bilat, TM's without erythema or bulging. +edemetous nasal turbinates bilat with clear rhinorrhea. Mouth and pharynx without lesions. No tonsillar exudates. No intra-oral edema. No submandibular or sublingual edema. No hoarse voice, no  drooling, no stridor. No pain with manipulation of larynx. No trismus. Mouth and pharynx normal, Mucous membranes moist; Neck: Supple, Full range of motion, No lymphadenopathy; Cardiovascular: Regular rate and rhythm, No murmur, rub, or gallop; Respiratory: Breath sounds clear & equal bilaterally, No rales, rhonchi, wheezes.  Speaking full sentences with ease, Normal respiratory effort/excursion; Chest: Nontender, Movement normal; Abdomen: Soft, Nontender, Nondistended, Normal bowel sounds; Genitourinary: No CVA tenderness; Extremities: Pulses normal, No tenderness, No edema, No calf edema or asymmetry.; Neuro: AA&Ox3, Major CN grossly intact. +left horizontal end gaze fatigable nystagmus. No facial droop. Speech clear. No gross focal motor or sensory deficits in extremities. Climbs on and off stretcher easily by himself. Gait steady.; Skin: Color normal, Warm, Dry.    ED Course  Procedures     MDM  MDM Reviewed: previous chart, nursing note and vitals     0820:  Pt is not orthostatic. Neuro exam intact. Tx sinus congestion and vertigo symptomatically at this time. Dx and testing d/w pt.  Questions answered.  Verb understanding, agreeable to d/c home with outpt f/u.   Samuel Jester, DO 01/10/14 1727

## 2014-05-23 ENCOUNTER — Encounter (HOSPITAL_COMMUNITY): Payer: Self-pay | Admitting: Emergency Medicine

## 2014-05-23 ENCOUNTER — Emergency Department (HOSPITAL_COMMUNITY)
Admission: EM | Admit: 2014-05-23 | Discharge: 2014-05-23 | Disposition: A | Discharge status: Home / Self Care | Payer: Non-veteran care | Attending: Emergency Medicine | Admitting: Emergency Medicine

## 2014-05-23 DIAGNOSIS — J320 Chronic maxillary sinusitis: Secondary | ICD-10-CM | POA: Insufficient documentation

## 2014-05-23 DIAGNOSIS — Z7952 Long term (current) use of systemic steroids: Secondary | ICD-10-CM | POA: Insufficient documentation

## 2014-05-23 DIAGNOSIS — J019 Acute sinusitis, unspecified: Secondary | ICD-10-CM

## 2014-05-23 DIAGNOSIS — G8929 Other chronic pain: Secondary | ICD-10-CM | POA: Insufficient documentation

## 2014-05-23 DIAGNOSIS — Z791 Long term (current) use of non-steroidal anti-inflammatories (NSAID): Secondary | ICD-10-CM | POA: Insufficient documentation

## 2014-05-23 DIAGNOSIS — Z87828 Personal history of other (healed) physical injury and trauma: Secondary | ICD-10-CM | POA: Insufficient documentation

## 2014-05-23 DIAGNOSIS — H938X3 Other specified disorders of ear, bilateral: Secondary | ICD-10-CM | POA: Insufficient documentation

## 2014-05-23 DIAGNOSIS — Z79899 Other long term (current) drug therapy: Secondary | ICD-10-CM | POA: Insufficient documentation

## 2014-05-23 MED ORDER — AMOXICILLIN-POT CLAVULANATE 500-125 MG PO TABS: 1.0000 | ORAL_TABLET | Freq: Two times a day (BID) | ORAL | Status: DC

## 2014-05-23 MED ORDER — PREDNISONE 50 MG PO TABS
60.0000 mg | ORAL_TABLET | Freq: Once | ORAL | Status: AC
  Administered 2014-05-23: 17:00:00 60 mg via ORAL
  Filled 2014-05-23 (×2): qty 1

## 2014-05-23 MED ORDER — AMOXICILLIN-POT CLAVULANATE 500-125 MG PO TABS
1.0000 | ORAL_TABLET | Freq: Once | ORAL | Status: AC
  Administered 2014-05-23: 500 mg via ORAL
  Filled 2014-05-23: qty 1

## 2014-05-23 MED ORDER — PREDNISONE 10 MG PO TABS: 10.0000 mg | ORAL_TABLET | Freq: Every day | ORAL | Status: DC

## 2014-05-23 NOTE — ED Notes (Signed)
Patient with no complaints at this time. Respirations even and unlabored. Skin warm/dry. Discharge instructions reviewed with patient at this time. Patient given opportunity to voice concerns/ask questions. Patient discharged at this time and left Emergency Department with steady gait.   

## 2014-05-23 NOTE — Discharge Instructions (Signed)
Sinusitis °Sinusitis is redness, soreness, and puffiness (inflammation) of the air pockets in the bones of your face (sinuses). The redness, soreness, and puffiness can cause air and mucus to get trapped in your sinuses. This can allow germs to grow and cause an infection.  °HOME CARE  °· Drink enough fluids to keep your pee (urine) clear or pale yellow. °· Use a humidifier in your home. °· Run a hot shower to create steam in the bathroom. Sit in the bathroom with the door closed. Breathe in the steam 3-4 times a day. °· Put a warm, moist washcloth on your face 3-4 times a day, or as told by your doctor. °· Use salt water sprays (saline sprays) to wet the thick fluid in your nose. This can help the sinuses drain. °· Only take medicine as told by your doctor. °GET HELP RIGHT AWAY IF:  °· Your pain gets worse. °· You have very bad headaches. °· You are sick to your stomach (nauseous). °· You throw up (vomit). °· You are very sleepy (drowsy) all the time. °· Your face is puffy (swollen). °· Your vision changes. °· You have a stiff neck. °· You have trouble breathing. °MAKE SURE YOU:  °· Understand these instructions. °· Will watch your condition. °· Will get help right away if you are not doing well or get worse. °Document Released: 10/08/2007 Document Revised: 01/14/2012 Document Reviewed: 11/25/2011 °Exit[employer]® Patient Information ©2015 [employer]. This information is not intended to replace advice given to you by your health care provider. Make sure you discuss any questions you have with your health care provider. ° °

## 2014-05-23 NOTE — ED Notes (Signed)
Patient states sinus pressure/congestion x 1 month with associated ear pain/fullness with intermittent dizziness that is related to position changes. Patient alert/oriented x 4. Congestion noted. States using sudafed and otc pain relief at home.

## 2014-05-26 NOTE — ED Provider Notes (Signed)
CSN: [id number]     Arrival date & time 05/23/14  1620 History   First MD Initiated Contact with Patient 05/23/14 1634     Chief Complaint  Patient presents with  . Ear Fullness  . Facial Pain     (Consider location/radiation/quality/duration/timing/severity/associated sxs/prior Treatment) HPI   Ethan Moore is a 54 y.o. male who presents to the Emergency Department complaining of sinus pressure and facial pain for one month.  He states he has been blowing frequently.  He also states that he feels pressure in his ears and becomes dizzy at times, with position changes.  He denies neck pain, stiffness, visual changes, vomiting or headaches.  He states that he was seen here several months ago for dizziness which is similar to what he was experiencing before. He has been using Flonase and sudafed on occasion w/o relief.    Past Medical History  Diagnosis Date  . Back injury   . Neck injury   . Chronic back pain   . Chronic neck pain   . Dizziness    History reviewed. No pertinent past surgical history. No family history on file. History  Substance Use Topics  . Smoking status: Never Smoker   . Smokeless tobacco: Not on file  . Alcohol Use: No    Review of Systems  Constitutional: Negative for fever, chills, activity change and appetite change.  HENT: Positive for congestion, rhinorrhea and sinus pressure. Negative for facial swelling, sore throat and trouble swallowing.   Eyes: Negative for visual disturbance.  Respiratory: Negative for cough, shortness of breath, wheezing and stridor.   Gastrointestinal: Negative for nausea and vomiting.  Musculoskeletal: Negative for neck pain and neck stiffness.  Skin: Negative.   Neurological: Positive for dizziness. Negative for weakness, numbness and headaches.  Hematological: Negative for adenopathy.  Psychiatric/Behavioral: Negative for confusion.  All other systems reviewed and are negative.     Allergies  Review of patient's  allergies indicates no known allergies.  Home Medications   Prior to Admission medications   Medication Sig Start Date End Date Taking? Authorizing Provider  acetaminophen (TYLENOL) 500 MG tablet Take 500 mg by mouth every 6 (six) hours as needed.    Historical Provider, MD  amoxicillin-clavulanate (AUGMENTIN) 500-125 MG per tablet Take 1 tablet (500 mg total) by mouth 2 (two) times daily. For 10 days 05/23/14   Ondra Deboard L. Anhthu Perdew, PA-C  dexamethasone (DECADRON) 6 MG tablet 1 by mouth twice a day with food. 03/28/13   Kathie Dike, PA-C  diazepam (VALIUM) 5 MG tablet 1 po tid for spasm pain 03/28/13   Kathie Dike, PA-C  diclofenac (VOLTAREN) 75 MG EC tablet Take 1 tablet (75 mg total) by mouth 2 (two) times daily. 03/28/13   Kathie Dike, PA-C  ibuprofen (ADVIL,MOTRIN) 200 MG tablet Take 200 mg by mouth every 6 (six) hours as needed.    Historical Provider, MD  ibuprofen (ADVIL,MOTRIN) 800 MG tablet Take 1 tablet (800 mg total) by mouth 3 (three) times daily. 03/08/13   Glynn Octave, MD  meclizine (ANTIVERT) 25 MG tablet Take 1 tablet (25 mg total) by mouth 3 (three) times daily as needed for dizziness. 01/09/14   Samuel Jester, DO  Multiple Vitamins-Minerals (MULTIVITAMINS THER. W/MINERALS) TABS Take 1 tablet by mouth daily.      Historical Provider, MD  oxyCODONE-acetaminophen (PERCOCET/ROXICET) 5-325 MG per tablet Take 2 tablets by mouth every 4 (four) hours as needed for severe pain. 03/08/13   Glynn Octave, MD  predniSONE (DELTASONE) 10 MG tablet Take 1 tablet (10 mg total) by mouth daily. 05/23/14   Ciaran Begay L. Avanna Sowder, PA-C   BP 136/88 mmHg  Pulse 72  Temp(Src) 97.7 F (36.5 C) (Oral)  Resp 16  Ht 6\' 4"  (1.93 m)  Wt 255 lb (115.667 kg)  BMI 31.05 kg/m2  SpO2 100% Physical Exam  Constitutional: He is oriented to person, place, and time. He appears well-developed and well-nourished. No distress.  HENT:  Head: Normocephalic and atraumatic.  Right Ear: Ear canal normal. A  middle ear effusion is present.  Left Ear: Ear canal normal. A middle ear effusion is present.  Nose: Mucosal edema and rhinorrhea present. Right sinus exhibits maxillary sinus tenderness. Left sinus exhibits maxillary sinus tenderness.  Mouth/Throat: Uvula is midline and mucous membranes are normal. No trismus in the jaw. No uvula swelling. Posterior oropharyngeal erythema present. No oropharyngeal exudate, posterior oropharyngeal edema or tonsillar abscesses.  Eyes: Conjunctivae are normal.  Neck: Normal range of motion and phonation normal. Neck supple. No Brudzinski's sign and no Kernig's sign noted.  Cardiovascular: Normal rate, regular rhythm, normal heart sounds and intact distal pulses.   No murmur heard. Pulmonary/Chest: Effort normal and breath sounds normal. No respiratory distress. He has no wheezes. He has no rales.  Musculoskeletal: He exhibits no edema.  Lymphadenopathy:    He has no cervical adenopathy.  Neurological: He is alert and oriented to person, place, and time. He exhibits normal muscle tone. Coordination normal.  Skin: Skin is warm and dry.  Nursing note and vitals reviewed.   ED Course  Procedures (including critical care time) Labs Review Labs Reviewed - No data to display  Imaging Review No results found.   EKG Interpretation None      MDM   Final diagnoses:  Subacute sinusitis, unspecified location    Pt is well appearing, VSS.  Non-toxic.  Sx's likely related to sinusitis.  He agrees to OTC afrin x 3 days, fluids tylenol if needed for fever and rx written for augmentin.  He appears stable for d/c and agrees to plan.    Wyatte Dames L. Trisha Mangleriplett, PA-C 05/26/14 [id number]  Benny LennertJoseph L Zammit, MD 05/26/14 [phone]

## 2014-10-08 ENCOUNTER — Emergency Department (HOSPITAL_COMMUNITY): Payer: Non-veteran care

## 2014-10-08 ENCOUNTER — Encounter (HOSPITAL_COMMUNITY): Payer: Self-pay | Admitting: Emergency Medicine

## 2014-10-08 ENCOUNTER — Emergency Department (HOSPITAL_COMMUNITY)
Admission: EM | Admit: 2014-10-08 | Discharge: 2014-10-08 | Disposition: A | Discharge status: Home / Self Care | Payer: Non-veteran care | Attending: Emergency Medicine | Admitting: Emergency Medicine

## 2014-10-08 DIAGNOSIS — Z87828 Personal history of other (healed) physical injury and trauma: Secondary | ICD-10-CM | POA: Insufficient documentation

## 2014-10-08 DIAGNOSIS — R079 Chest pain, unspecified: Secondary | ICD-10-CM | POA: Insufficient documentation

## 2014-10-08 DIAGNOSIS — G8929 Other chronic pain: Secondary | ICD-10-CM | POA: Insufficient documentation

## 2014-10-08 DIAGNOSIS — R091 Pleurisy: Secondary | ICD-10-CM | POA: Diagnosis present

## 2014-10-08 DIAGNOSIS — R0789 Other chest pain: Secondary | ICD-10-CM

## 2014-10-08 DIAGNOSIS — Z79899 Other long term (current) drug therapy: Secondary | ICD-10-CM | POA: Insufficient documentation

## 2014-10-08 MED ORDER — KETOROLAC TROMETHAMINE 30 MG/ML IJ SOLN
30.0000 mg | Freq: Once | INTRAMUSCULAR | Status: DC
  Filled 2014-10-08: qty 1

## 2014-10-08 MED ORDER — PREDNISONE 50 MG PO TABS
60.0000 mg | ORAL_TABLET | ORAL | Status: AC
  Administered 2014-10-08: 60 mg via ORAL
  Filled 2014-10-08 (×2): qty 1

## 2014-10-08 MED ORDER — PREDNISONE 20 MG PO TABS: 60.0000 mg | ORAL_TABLET | Freq: Every day | ORAL | Status: AC

## 2014-10-08 NOTE — ED Notes (Signed)
Pt declined pain medication and IV

## 2014-10-08 NOTE — Discharge Instructions (Signed)
As discussed, your evaluation today has been largely reassuring.  But, it is important that you monitor your condition carefully, and do not hesitate to return to the ED if you develop new, or concerning changes in your condition.  Your discomfort is likely due to inflammatory changes in the lining of her chest cavity.  Please take all medication as directed.  Otherwise, please follow-up with your physician for appropriate ongoing care.

## 2014-10-08 NOTE — ED Notes (Signed)
Patient c/o "pulling in chest" when taking a deep breath in x1 week. Denies any chest pain, cough, or shortness of breath. Patient states "I work in the heat and my [facility] is always bl[facility]. I don't know if that has anything to do with it." Patient unsure of fevers but reports sweating and states "that could just be due to weather". Patient is not diaphoretic at this time. Does report some sinus congestion.

## 2014-10-08 NOTE — ED Provider Notes (Signed)
CSN: [id number]     Arrival date & time 10/08/14  1412 History   First MD Initiated Contact with Patient 10/08/14 1424     Chief Complaint  Patient presents with  . Pleurisy     (Consider location/radiation/quality/duration/timing/severity/associated sxs/prior Treatment) HPI Patient presents with concern of chest tightness. With the past several days, without clear precipitant, the patient has had episodic tightness. The diagnoses anterior, throughout the precordial area. Tightness is present with deep inspiration, otherwise not present. There is no exertional pain, nor any true pain at all. No lightheadedness, syncope, cough, dyspnea. Patient acknowledges taking a new job, requiring more exertion, as well as increased time in air-conditioned environments recently. Patient does not smoke Past Medical History  Diagnosis Date  . Back injury   . Neck injury   . Chronic back pain   . Chronic neck pain   . Dizziness    History reviewed. No pertinent past surgical history. History reviewed. No pertinent family history. History  Substance Use Topics  . Smoking status: Never Smoker   . Smokeless tobacco: Not on file  . Alcohol Use: No    Review of Systems  Constitutional:       Per HPI, otherwise negative  HENT:       Chronic neck pain, unchanged  Respiratory:       Per HPI, otherwise negative  Cardiovascular:       Per HPI, otherwise negative  Gastrointestinal: Negative for vomiting.  Endocrine:       Negative aside from HPI  Genitourinary:       Neg aside from HPI   Musculoskeletal:       Per HPI, otherwise negative  Skin: Negative.   Neurological: Negative for syncope.      Allergies  Review of patient's allergies indicates no known allergies.  Home Medications   Prior to Admission medications   Medication Sig Start Date End Date Taking? Authorizing Provider  cetirizine (ZYRTEC) 10 MG tablet Take 10 mg by mouth daily.   Yes Historical Provider, MD   cyclobenzaprine (FLEXERIL) 10 MG tablet Take 10 mg by mouth 3 (three) times daily as needed for muscle spasms.   Yes Historical Provider, MD  Multiple Vitamins-Minerals (MULTIVITAMINS THER. W/MINERALS) TABS Take 1 tablet by mouth daily.      Historical Provider, MD   BP 129/82 mmHg  Pulse 83  Temp(Src) 98.7 F (37.1 C) (Oral)  Resp 15  Ht  (1.93 m)  Wt 260 lb (117.935 kg)  BMI 31.66 kg/m2  SpO2 100% Physical Exam  Constitutional: He is oriented to person, place, and time. He appears well-developed. No distress.  HENT:  Head: Normocephalic and atraumatic.  Mouth/Throat: Oropharynx is clear and moist. No oropharyngeal exudate.  Eyes: Conjunctivae and EOM are normal.  Cardiovascular: Normal rate and regular rhythm.   Pulmonary/Chest: Effort normal. No stridor. No respiratory distress.  Mild tenderness to palpation with pressure applied to the sternum  Abdominal: He exhibits no distension.  Musculoskeletal: He exhibits no edema.  Neurological: He is alert and oriented to person, place, and time.  Skin: Skin is warm and dry.  Psychiatric: He has a normal mood and affect.  Nursing note and vitals reviewed.   ED Course  Procedures (including critical care time) Labs Review Labs Reviewed - No data to display  Imaging Review Dg Chest 2 View  10/08/2014   CLINICAL DATA:  Anterior chest pain with deep inspiration  EXAM: CHEST  2 VIEW  COMPARISON:  03/08/2013  FINDINGS: Normal heart size, mediastinal contours, and pulmonary vascularity.  Lungs clear.  No pneumothorax.  Bones unremarkable.  IMPRESSION: Normal exam.   Electronically Signed   By: Ulyses SouthwardMark  Boles M.D.   On: 10/08/2014 15:49     EKG Interpretation   Date/Time:  Sunday October 08 2014 15:02:28 EDT Ventricular Rate:  68 PR Interval:  197 QRS Duration: 100 QT Interval:  391 QTC Calculation: 416 R Axis:   88 Text Interpretation:  Sinus rhythm Consider left atrial enlargement Sinus  rhythm Normal ECG Confirmed by Gerhard MunchLOCKWOOD,  Bayla Mcgovern  MD [phone] on 10/08/2014  3:53:06 PM     on repeat exam the patient is awake, alert, states that he feels better. We discussed all findings, the need to follow-up with primary care if he does not improve after initiation of medication.    Oximetry 100% room air normal Cardiac 80 sinus normal  MDM  Patient presents with new discomfort with deep inspiration, chest tightness, for several days. Patient is in no distress, awake, alert, afebrile, no cough, unremarkable lung sounds, clean chest x-ray, nonischemic EKG, and there is low suspicion for occult infection, or bacteremia. Low suspicion for ongoing ischemia. Patient has no risk factors for pulmonary embolism. Patient improved here after initiation of anti-inflammatory, steroids, was discharged in stable condition with similar medication for presumed inflammatory etiology.   Gerhard Munchobert Jatavis Malek, MD 10/08/14 [phone]

## 2014-11-04 ENCOUNTER — Emergency Department (HOSPITAL_COMMUNITY): Payer: Non-veteran care

## 2014-11-04 ENCOUNTER — Encounter (HOSPITAL_COMMUNITY): Payer: Self-pay | Admitting: *Deleted

## 2014-11-04 ENCOUNTER — Ambulatory Visit (HOSPITAL_COMMUNITY)
Admit: 2014-11-04 | Discharge: 2014-11-04 | Disposition: A | Discharge status: Home / Self Care | Payer: Non-veteran care | Source: Ambulatory Visit | Attending: Emergency Medicine | Admitting: Emergency Medicine

## 2014-11-04 ENCOUNTER — Emergency Department (HOSPITAL_COMMUNITY)
Admission: EM | Admit: 2014-11-04 | Discharge: 2014-11-04 | Disposition: A | Discharge status: Home / Self Care | Payer: Non-veteran care | Attending: Emergency Medicine | Admitting: Emergency Medicine

## 2014-11-04 DIAGNOSIS — M25561 Pain in right knee: Secondary | ICD-10-CM | POA: Diagnosis not present

## 2014-11-04 DIAGNOSIS — R609 Edema, unspecified: Secondary | ICD-10-CM | POA: Insufficient documentation

## 2014-11-04 DIAGNOSIS — M25461 Effusion, right knee: Secondary | ICD-10-CM | POA: Diagnosis not present

## 2014-11-04 DIAGNOSIS — G8929 Other chronic pain: Secondary | ICD-10-CM | POA: Insufficient documentation

## 2014-11-04 DIAGNOSIS — M7121 Synovial cyst of popliteal space [Baker], right knee: Secondary | ICD-10-CM | POA: Insufficient documentation

## 2014-11-04 DIAGNOSIS — Z79899 Other long term (current) drug therapy: Secondary | ICD-10-CM | POA: Insufficient documentation

## 2014-11-04 DIAGNOSIS — Z87828 Personal history of other (healed) physical injury and trauma: Secondary | ICD-10-CM | POA: Insufficient documentation

## 2014-11-04 DIAGNOSIS — M79604 Pain in right leg: Secondary | ICD-10-CM

## 2014-11-04 MED ORDER — ENOXAPARIN SODIUM 100 MG/ML ~~LOC~~ SOLN
120.0000 mg | Freq: Once | SUBCUTANEOUS | Status: AC
  Administered 2014-11-04: 120 mg via SUBCUTANEOUS
  Filled 2014-11-04: qty 2

## 2014-11-04 MED ORDER — IBUPROFEN 800 MG PO TABS: 800.0000 mg | ORAL_TABLET | Freq: Three times a day (TID) | ORAL | Status: DC | PRN

## 2014-11-04 MED ORDER — TRAMADOL HCL 50 MG PO TABS: 50.0000 mg | ORAL_TABLET | Freq: Four times a day (QID) | ORAL | Status: DC | PRN

## 2014-11-04 MED ORDER — IBUPROFEN 800 MG PO TABS
800.0000 mg | ORAL_TABLET | Freq: Once | ORAL | Status: AC
  Administered 2014-11-04: 800 mg via ORAL
  Filled 2014-11-04: qty 1

## 2014-11-04 NOTE — ED Provider Notes (Signed)
  10:28  Patient returned today for scheduled outpatient venous US of the right LE.  No evidence of DVT.  Has a right Baker's cyst popliteal fossa.  Agrees to arrange ortho f/u   Pauline Ausammy Torrin Frein, PA-C 11/04/14 1043  Mancel BaleElliott Wentz, MD 11/04/14 [phone]

## 2014-11-04 NOTE — ED Notes (Signed)
Pt c/o pain to back of knee with walking

## 2014-11-04 NOTE — ED Notes (Signed)
Pt c/o pain and swelling to right knee that started x 1 1/2 weeks ago

## 2014-11-04 NOTE — ED Provider Notes (Signed)
TIME SEEN: 6:00 AM  CHIEF COMPLAINT: Right knee pain and swelling  HPI: Pt is a 54 y.o. male with history of chronic neck and back pain who presents to the emergency department with right knee pain and swelling for the past 1-2 weeks. Denies any history of injury. States he does also have pain in the lateral and posterior calf. Pain is worse with walking and better with sitting down. Described as throbbing, moderate in nature. No radiation of pain. No erythema or warmth. No fever. No prior history of DVT.  ROS: See HPI Constitutional: no fever  Eyes: no drainage  ENT: no runny nose   Cardiovascular:  no chest pain  Resp: no SOB  GI: no vomiting GU: no dysuria Integumentary: no rash  Allergy: no hives  Musculoskeletal: no leg swelling  Neurological: no slurred speech ROS otherwise negative  PAST MEDICAL HISTORY/PAST SURGICAL HISTORY:  Past Medical History  Diagnosis Date  . Back injury   . Neck injury   . Chronic back pain   . Chronic neck pain   . Dizziness     MEDICATIONS:  Prior to Admission medications   Medication Sig Start Date End Date Taking? Authorizing Provider  cetirizine (ZYRTEC) 10 MG tablet Take 10 mg by mouth daily.    Historical Provider, MD  cyclobenzaprine (FLEXERIL) 10 MG tablet Take 10 mg by mouth 3 (three) times daily as needed for muscle spasms.    Historical Provider, MD  Multiple Vitamins-Minerals (MULTIVITAMINS THER. W/MINERALS) TABS Take 1 tablet by mouth daily.      Historical Provider, MD    ALLERGIES:  No Known Allergies  SOCIAL HISTORY:  History  Substance Use Topics  . Smoking status: Never Smoker   . Smokeless tobacco: Not on file  . Alcohol Use: No    FAMILY HISTORY: History reviewed. No pertinent family history.  EXAM: BP 130/84 mmHg  Pulse 63  Temp(Src) 98.2 F (36.8 C) (Oral)  Resp 18  Ht  (1.93 m)  Wt 260 lb (117.935 kg)  BMI 31.66 kg/m2  SpO2 97% CONSTITUTIONAL: Alert and oriented and responds appropriately to  questions. Well-appearing; well-nourished HEAD: Normocephalic EYES: Conjunctivae clear, PERRL ENT: normal nose; no rhinorrhea; moist mucous membranes; pharynx without lesions noted NECK: Supple, no meningismus, no LAD  CARD: RRR; S1 and S2 appreciated; no murmurs, no clicks, no rubs, no gallops RESP: Normal chest excursion without splinting or tachypnea; breath sounds clear and equal bilaterally; no wheezes, no rhonchi, no rales, no hypoxia or respiratory distress, speaking full sentences ABD/GI: Normal bowel sounds; non-distended; soft, non-tender, no rebound, no guarding, no peritoneal signs BACK:  The back appears normal and is non-tender to palpation, there is no CVA tenderness EXT: Tender to palpation over the posterior right knee without obvious Baker cyst, also tender to palpation over the posterior right calf with some associated swelling, no erythema or warmth, no joint effusion, no ligamentous laxity, Normal ROM in all joints; otherwise extremities are non-tender to palpation; no edema; normal capillary refill; no cyanosis, 2+ DP pulse on the right side, normal sensation diffusely, compartments are soft SKIN: Normal color for age and race; warm NEURO: Moves all extremities equally, sensation to light touch intact diffusely, cranial nerves II through XII intact PSYCH: The patient's mood and manner are appropriate. Grooming and personal hygiene are appropriate.  MEDICAL DECISION MAKING: Patient here with right knee pain. He also has swelling on exam. No sign of septic arthritis. No history of known injury. Neurovascular intact distally. Patient  also complaining of posterior calf pain. X-ray of the right knee shows  mild osteoarthritic changes with no acute findings. Have also ordered a venous Doppler of his lower extremity which he will come back for later today to rule out DVT given his popliteal and posterior calf pain.  He has been given a dose of Lovenox in the emergency department. We'll  discharge with prescriptions for ibuprofen, tramadol for pain. Discussed return precautions. We'll get orthopedic follow-up information. He verbalizes understanding and is comfortable with this plan.      Layla MawKristen N Rylon Poitra, DO 11/04/14 [phone]

## 2014-11-04 NOTE — Discharge Instructions (Signed)

## 2015-05-05 ENCOUNTER — Encounter (HOSPITAL_COMMUNITY): Payer: Self-pay

## 2015-05-05 ENCOUNTER — Emergency Department (HOSPITAL_COMMUNITY): Payer: Non-veteran care

## 2015-05-05 ENCOUNTER — Emergency Department (HOSPITAL_COMMUNITY)
Admission: EM | Admit: 2015-05-05 | Discharge: 2015-05-05 | Disposition: A | Discharge status: Home / Self Care | Payer: Non-veteran care | Attending: Emergency Medicine | Admitting: Emergency Medicine

## 2015-05-05 DIAGNOSIS — Z87828 Personal history of other (healed) physical injury and trauma: Secondary | ICD-10-CM | POA: Diagnosis not present

## 2015-05-05 DIAGNOSIS — Z79899 Other long term (current) drug therapy: Secondary | ICD-10-CM | POA: Diagnosis not present

## 2015-05-05 DIAGNOSIS — M549 Dorsalgia, unspecified: Secondary | ICD-10-CM | POA: Diagnosis not present

## 2015-05-05 DIAGNOSIS — J069 Acute upper respiratory infection, unspecified: Secondary | ICD-10-CM | POA: Diagnosis not present

## 2015-05-05 DIAGNOSIS — G8929 Other chronic pain: Secondary | ICD-10-CM | POA: Insufficient documentation

## 2015-05-05 DIAGNOSIS — R05 Cough: Secondary | ICD-10-CM | POA: Diagnosis present

## 2015-05-05 NOTE — ED Provider Notes (Signed)
CSN: [id number]     Arrival date & time 05/05/15  0619 History   First MD Initiated Contact with Patient 05/05/15 [phone]     Chief Complaint  Patient presents with  . chest congestion      (Consider location/radiation/quality/duration/timing/severity/associated sxs/prior Treatment) The history is provided by the patient.   patient's had a cough for the last few days. No production. Also a sore throat and some head congestion. States it feels as if there is cold air and his nose. States he does work outside. No fevers. No chills. No difficult breathing. He has not taken anything for it. He does not smoke and does not have a history of asthma or COPD.    Past Medical History  Diagnosis Date  . Back injury   . Neck injury   . Chronic back pain   . Chronic neck pain   . Dizziness    No past surgical history on file. No family history on file. Social History  Substance Use Topics  . Smoking status: Never Smoker   . Smokeless tobacco: None  . Alcohol Use: No    Review of Systems  Constitutional: Negative for appetite change and fatigue.  HENT: Positive for congestion and sore throat.   Respiratory: Positive for cough. Negative for shortness of breath.   Cardiovascular: Negative for chest pain.  Gastrointestinal: Negative for abdominal pain.  Genitourinary: Negative for flank pain.  Musculoskeletal: Positive for back pain.  Skin: Negative for wound.      Allergies  Review of patient's allergies indicates no known allergies.  Home Medications   Prior to Admission medications   Medication Sig Start Date End Date Taking? Authorizing Provider  cyclobenzaprine (FLEXERIL) 10 MG tablet Take 10 mg by mouth 3 (three) times daily as needed for muscle spasms.   Yes Historical Provider, MD  DULoxetine (CYMBALTA) 30 MG capsule Take 30 mg by mouth daily.   Yes Historical Provider, MD  ibuprofen (ADVIL,MOTRIN) 800 MG tablet Take 1 tablet (800 mg total) by mouth every 8 (eight) hours as  needed for mild pain. 11/04/14  Yes Kristen N Ward, DO  loratadine (CLARITIN) 10 MG tablet Take 10 mg by mouth daily.   Yes Historical Provider, MD  Multiple Vitamins-Minerals (MULTIVITAMINS THER. W/MINERALS) TABS Take 1 tablet by mouth daily.     Yes Historical Provider, MD  ranitidine (ZANTAC) 150 MG tablet Take 150 mg by mouth 2 (two) times daily.   Yes Historical Provider, MD  traMADol (ULTRAM) 50 MG tablet Take 1 tablet (50 mg total) by mouth every 6 (six) hours as needed. 11/04/14  Yes Kristen N Ward, DO  traZODone (DESYREL) 50 MG tablet Take 50 mg by mouth at bedtime.   Yes Historical Provider, MD  cetirizine (ZYRTEC) 10 MG tablet Take 10 mg by mouth daily.    Historical Provider, MD   BP 130/83 mmHg  Pulse 67  Temp(Src) 98.1 F (36.7 C) (Oral)  Resp 20  Ht  (1.93 m)  Wt 272 lb (123.378 kg)  BMI 33.12 kg/m2  SpO2 100% Physical Exam  Constitutional: He appears well-developed.  HENT:  Head: Atraumatic.  Mouth/Throat: No oropharyngeal exudate.  Slight posterior pharyngeal erythema without exudate.  Cardiovascular: Normal rate.   Pulmonary/Chest: Effort normal and breath sounds normal.  Neurological: He is alert.    ED Course  Procedures (including critical care time) Labs Review Labs Reviewed - No data to display  Imaging Review No results found. I have personally reviewed and evaluated these  images and lab results as part of my medical decision-making.   EKG Interpretation   Date/Time:  Saturday May 05 2015 06:32:18 EST Ventricular Rate:  56 PR Interval:  222 QRS Duration: 103 QT Interval:  410 QTC Calculation: 396 R Axis:   81 Text Interpretation:  Sinus rhythm Prolonged PR interval Low voltage,  precordial leads Probable anteroseptal infarct, old Baseline wander in  lead(s) V3 T wave inversion Abnormal ekg Confirmed by Bebe ShaggyWICKLINE  MD, DONALD  [phone]) on 05/05/2015 6:36:01 AM      MDM   Final diagnoses:  URI (upper respiratory infection)     patient mild URI versus allergic symptoms. Normal lungs and no pneumonia. Will discharge home.  Benjiman CoreNathan Chioma Mukherjee, MD 05/05/15 [phone]

## 2015-05-05 NOTE — ED Notes (Signed)
Pt c/o chest and nasal congestion since Wednesday.

## 2015-05-05 NOTE — Discharge Instructions (Signed)
Upper Respiratory Infection, Adult Most upper respiratory infections (URIs) are a viral infection of the air passages leading to the lungs. A URI affects the nose, throat, and upper air passages. The most common type of URI is nasopharyngitis and is typically referred to as "the common cold." URIs run their course and usually go away on their own. Most of the time, a URI does not require medical attention, but sometimes a bacterial infection in the upper airways can follow a viral infection. This is called a secondary infection. Sinus and middle ear infections are common types of secondary upper respiratory infections. Bacterial pneumonia can also complicate a URI. A URI can worsen asthma and chronic obstructive pulmonary disease (COPD). Sometimes, these complications can require emergency medical care and may be life threatening.  CAUSES Almost all URIs are caused by viruses. A virus is a type of germ and can spread from one person to another.  RISKS FACTORS You may be at risk for a URI if:   You smoke.   You have chronic heart or lung disease.  You have a weakened defense (immune) system.   You are very young or very old.   You have nasal allergies or asthma.  You work in crowded or poorly ventilated areas.  You work in health care facilities or schools. SIGNS AND SYMPTOMS  Symptoms typically develop 2-3 days after you come in contact with a cold virus. Most viral URIs last 7-10 days. However, viral URIs from the influenza virus (flu virus) can last 14-18 days and are typically more severe. Symptoms may include:   Runny or stuffy (congested) nose.   Sneezing.   Cough.   Sore throat.   Headache.   Fatigue.   Fever.   Loss of appetite.   Pain in your forehead, behind your eyes, and over your cheekbones (sinus pain).  Muscle aches.  DIAGNOSIS  Your health care provider may diagnose a URI by:  Physical exam.  Tests to check that your symptoms are not due to  another condition such as:  Strep throat.  Sinusitis.  Pneumonia.  Asthma. TREATMENT  A URI goes away on its own with time. It cannot be cured with medicines, but medicines may be prescribed or recommended to relieve symptoms. Medicines may help:  Reduce your fever.  Reduce your cough.  Relieve nasal congestion. HOME CARE INSTRUCTIONS   Take medicines only as directed by your health care provider.   Gargle warm saltwater or take cough drops to comfort your throat as directed by your health care provider.  Use a warm mist humidifier or inhale steam from a shower to increase air moisture. This may make it easier to breathe.  Drink enough fluid to keep your urine clear or pale yellow.   Eat soups and other clear broths and maintain good nutrition.   Rest as needed.   Return to work when your temperature has returned to normal or as your health care provider advises. You may need to stay home longer to avoid infecting others. You can also use a face mask and careful hand washing to prevent spread of the virus.  Increase the usage of your inhaler if you have asthma.   Do not use any tobacco products, including cigarettes, chewing tobacco, or electronic cigarettes. If you need help quitting, ask your health care provider. PREVENTION  The best way to protect yourself from getting a cold is to practice good hygiene.   Avoid oral or hand contact with people with cold   symptoms.   Wash your hands often if contact occurs.  There is no clear evidence that vitamin C, vitamin E, echinacea, or exercise reduces the chance of developing a cold. However, it is always recommended to get plenty of rest, exercise, and practice good nutrition.  SEEK MEDICAL CARE IF:   You are getting worse rather than better.   Your symptoms are not controlled by medicine.   You have chills.  You have worsening shortness of breath.  You have brown or red mucus.  You have yellow or brown nasal  discharge.  You have pain in your face, especially when you bend forward.  You have a fever.  You have swollen neck glands.  You have pain while swallowing.  You have white areas in the back of your throat. SEEK IMMEDIATE MEDICAL CARE IF:   You have severe or persistent:  Headache.  Ear pain.  Sinus pain.  Chest pain.  You have chronic lung disease and any of the following:  Wheezing.  Prolonged cough.  Coughing up blood.  A change in your usual mucus.  You have a stiff neck.  You have changes in your:  Vision.  Hearing.  Thinking.  Mood. MAKE SURE YOU:   Understand these instructions.  Will watch your condition.  Will get help right away if you are not doing well or get worse.   This information is not intended to replace advice given to you by your health care provider. Make sure you discuss any questions you have with your health care provider.   Document Released: 10/15/2000 Document Revised: 09/05/2014 Document Reviewed: 07/27/2013 Elsevier Interactive Patient Education 2016 [employer]  

## 2015-06-25 ENCOUNTER — Emergency Department (HOSPITAL_COMMUNITY)
Admission: EM | Admit: 2015-06-25 | Discharge: 2015-06-25 | Disposition: A | Discharge status: Home / Self Care | Payer: Non-veteran care | Attending: Emergency Medicine | Admitting: Emergency Medicine

## 2015-06-25 DIAGNOSIS — K469 Unspecified abdominal hernia without obstruction or gangrene: Secondary | ICD-10-CM | POA: Insufficient documentation

## 2015-06-25 DIAGNOSIS — G8929 Other chronic pain: Secondary | ICD-10-CM | POA: Diagnosis not present

## 2015-06-26 ENCOUNTER — Encounter (HOSPITAL_COMMUNITY): Payer: Self-pay

## 2015-06-26 ENCOUNTER — Emergency Department (HOSPITAL_COMMUNITY)
Admission: EM | Admit: 2015-06-26 | Discharge: 2015-06-26 | Disposition: A | Discharge status: Home / Self Care | Payer: Non-veteran care | Attending: Emergency Medicine | Admitting: Emergency Medicine

## 2015-06-26 DIAGNOSIS — Y939 Activity, unspecified: Secondary | ICD-10-CM | POA: Insufficient documentation

## 2015-06-26 DIAGNOSIS — Y990 Civilian activity done for income or pay: Secondary | ICD-10-CM | POA: Insufficient documentation

## 2015-06-26 DIAGNOSIS — W241XXA Contact with transmission devices, not elsewhere classified, initial encounter: Secondary | ICD-10-CM | POA: Diagnosis not present

## 2015-06-26 DIAGNOSIS — S76212D Strain of adductor muscle, fascia and tendon of left thigh, subsequent encounter: Secondary | ICD-10-CM

## 2015-06-26 DIAGNOSIS — Y9269 Other specified industrial and construction area as the place of occurrence of the external cause: Secondary | ICD-10-CM | POA: Insufficient documentation

## 2015-06-26 DIAGNOSIS — S86812A Strain of other muscle(s) and tendon(s) at lower leg level, left leg, initial encounter: Secondary | ICD-10-CM | POA: Insufficient documentation

## 2015-06-26 NOTE — ED Notes (Signed)
Pt reports pain in left groin after slipping on a conveyor belt at work.  Pt says groin feels swollen but says can't feel a knot.  Pt says left testicle tender but not swollen.

## 2015-06-26 NOTE — ED Provider Notes (Signed)
CSN: [id number]     Arrival date & time 06/26/15  1737 History   First MD Initiated Contact with Patient 06/26/15 2059     Chief Complaint  Patient presents with  . Groin Pain     (Consider location/radiation/quality/duration/timing/severity/associated sxs/prior Treatment) Patient is a 55 y.o. male presenting with groin pain. The history is provided by the patient. No language interpreter was used.  Groin Pain This is a new problem. The current episode started in the past 7 days. The problem occurs constantly. The problem has been gradually worsening. Pertinent negatives include no abdominal pain. Nothing aggravates the symptoms. He has tried nothing for the symptoms. The treatment provided moderate relief.  Pt reports he pulled his groin on a conveyor belt.  Pt complains of soreness in left groin, left lower abdomen  Past Medical History  Diagnosis Date  . Back injury   . Neck injury   . Chronic back pain   . Chronic neck pain   . Dizziness    History reviewed. No pertinent past surgical history. No family history on file. Social History  Substance Use Topics  . Smoking status: Never Smoker   . Smokeless tobacco: None  . Alcohol Use: No    Review of Systems  Gastrointestinal: Negative for abdominal pain.  All other systems reviewed and are negative.     Allergies  Review of patient's allergies indicates no known allergies.  Home Medications   Prior to Admission medications   Medication Sig Start Date End Date Taking? Authorizing Provider  cetirizine (ZYRTEC) 10 MG tablet Take 10 mg by mouth daily.   Yes Historical Provider, MD  cyclobenzaprine (FLEXERIL) 10 MG tablet Take 10 mg by mouth 3 (three) times daily as needed for muscle spasms.   Yes Historical Provider, MD  DULoxetine (CYMBALTA) 30 MG capsule Take 30 mg by mouth daily.   Yes Historical Provider, MD  Multiple Vitamins-Minerals (MULTIVITAMINS THER. W/MINERALS) TABS Take 1 tablet by mouth daily.     Yes  Historical Provider, MD  traMADol (ULTRAM) 50 MG tablet Take 1 tablet (50 mg total) by mouth every 6 (six) hours as needed. 11/04/14  Yes Kristen N Ward, DO   BP 128/89 mmHg  Pulse 62  Temp(Src) 98.2 F (36.8 C) (Oral)  Resp 16  Ht  (1.93 m)  Wt 86.183 kg  BMI 23.14 kg/m2  SpO2 99% Physical Exam  Constitutional: He is oriented to person, place, and time. He appears well-developed and well-nourished.  HENT:  Head: Normocephalic.  Eyes: EOM are normal.  Neck: Normal range of motion.  Pulmonary/Chest: Effort normal.  Abdominal: He exhibits no distension. There is tenderness.  No defect, no hernia  Genitourinary:  No testicular swelling, no henia  Musculoskeletal: Normal range of motion.  Neurological: He is alert and oriented to person, place, and time.  Psychiatric: He has a normal mood and affect.  Nursing note and vitals reviewed.   ED Course  Procedures (including critical care time) Labs Review Labs Reviewed  URINALYSIS, ROUTINE W REFLEX MICROSCOPIC (NOT AT [facility])    Imaging Review No results found. I have personally reviewed and evaluated these images and lab results as part of my medical decision-making.   EKG Interpretation None      MDM   Final diagnoses:  Groin strain, left, subsequent encounter    Pt counseled on groin strain. Ibuprofen Return if any problems. An After Visit Summary was printed and given to the patient.    Elson Areas,  PA-C 06/26/15 2302  Glynn Octave, MD 06/27/15 1610

## 2015-06-26 NOTE — ED Notes (Signed)
Pt states his legs were kind of pulled apart on a conveyor belt, reports pain to the left groin, tender in testicles. Some pain radiating up into lowe abdomen.

## 2015-06-26 NOTE — ED Notes (Signed)
Pt alert & oriented x4, stable gait. Patient given discharge instructions, paperwork & prescription(s). Patient  instructed to stop at the registration desk to finish any additional paperwork. Patient verbalized understanding. Pt left department w/ no further questions. 

## 2015-06-26 NOTE — Discharge Instructions (Signed)

## 2016-01-12 ENCOUNTER — Emergency Department (HOSPITAL_COMMUNITY)
Admission: EM | Admit: 2016-01-12 | Discharge: 2016-01-12 | Disposition: A | Discharge status: Home / Self Care | Payer: Non-veteran care | Attending: Emergency Medicine | Admitting: Emergency Medicine

## 2016-01-12 ENCOUNTER — Emergency Department (HOSPITAL_COMMUNITY): Payer: Non-veteran care

## 2016-01-12 ENCOUNTER — Encounter (HOSPITAL_COMMUNITY): Payer: Self-pay | Admitting: Emergency Medicine

## 2016-01-12 DIAGNOSIS — H578 Other specified disorders of eye and adnexa: Secondary | ICD-10-CM | POA: Diagnosis not present

## 2016-01-12 DIAGNOSIS — X58XXXA Exposure to other specified factors, initial encounter: Secondary | ICD-10-CM | POA: Diagnosis not present

## 2016-01-12 DIAGNOSIS — H5789 Other specified disorders of eye and adnexa: Secondary | ICD-10-CM

## 2016-01-12 DIAGNOSIS — Z79899 Other long term (current) drug therapy: Secondary | ICD-10-CM | POA: Diagnosis not present

## 2016-01-12 DIAGNOSIS — Y929 Unspecified place or not applicable: Secondary | ICD-10-CM | POA: Insufficient documentation

## 2016-01-12 DIAGNOSIS — Y9389 Activity, other specified: Secondary | ICD-10-CM | POA: Diagnosis not present

## 2016-01-12 DIAGNOSIS — T1592XA Foreign body on external eye, part unspecified, left eye, initial encounter: Secondary | ICD-10-CM | POA: Diagnosis present

## 2016-01-12 DIAGNOSIS — Y990 Civilian activity done for income or pay: Secondary | ICD-10-CM | POA: Diagnosis not present

## 2016-01-12 DIAGNOSIS — Z23 Encounter for immunization: Secondary | ICD-10-CM | POA: Diagnosis not present

## 2016-01-12 MED ORDER — TETRACAINE HCL 0.5 % OP SOLN
2.0000 [drp] | Freq: Once | OPHTHALMIC | Status: AC
  Administered 2016-01-12: 2 [drp] via OPHTHALMIC
  Filled 2016-01-12: qty 4

## 2016-01-12 MED ORDER — TOBRAMYCIN 0.3 % OP SOLN
2.0000 [drp] | Freq: Once | OPHTHALMIC | Status: AC
  Administered 2016-01-12: 2 [drp] via OPHTHALMIC
  Filled 2016-01-12: qty 5

## 2016-01-12 MED ORDER — TETANUS-DIPHTH-ACELL PERTUSSIS 5-2.5-18.5 LF-MCG/0.5 IM SUSP
0.5000 mL | Freq: Once | INTRAMUSCULAR | Status: AC
  Administered 2016-01-12: 0.5 mL via INTRAMUSCULAR
  Filled 2016-01-12: qty 0.5

## 2016-01-12 NOTE — ED Notes (Signed)
Pa  at bedside. 

## 2016-01-12 NOTE — ED Triage Notes (Signed)
Reports was grinding metal at work Thursday and went to change glasses and felt a piece go in left eye.  No redness or tearing noted.

## 2016-01-12 NOTE — Discharge Instructions (Signed)
Your vital signs are within normal limits. The CT scan of your eye is negative for foreign body. Your examination shows irritation of your eye on. Please use 2 drops of tobramycin to your left eye every 4 hours for the next 5 days. Please see your eye specialist if not improving.

## 2016-01-12 NOTE — ED Provider Notes (Signed)
[facility] Provider Note   CSN: [id number] Arrival date & time: 01/12/16  1244     History   Chief Complaint Chief Complaint  Patient presents with  . Foreign Body in Eye    HPI Ethan Moore is a 55 y.o. male.  Patient is a 55 year old male who presents to the emergency department with a complaint of foreign body in the eye.  The patient states that on Thursday, September 7 he was near someone who was grinding metal. He was in the midst of changing his glasses when a piece of something flew into his eye. He has been trying to flush it out. He feels as though it's in the lower lid area of his eye. He states however the eye feels irritated a lot. He had a few episodes of blurry type vision, but no other problems. He's not had any fever or chills recently. His been no double vision. The patient is not had any drainage from his left eye. There's been no recent operation or procedure on the right or left eye    Foreign Body in Eye     Past Medical History:  Diagnosis Date  . Back injury   . Chronic back pain   . Chronic neck pain   . Dizziness   . Neck injury     There are no active problems to display for this patient.   History reviewed. No pertinent surgical history.     Home Medications    Prior to Admission medications   Medication Sig Start Date End Date Taking? Authorizing Provider  cetirizine (ZYRTEC) 10 MG tablet Take 10 mg by mouth daily.    Historical Provider, MD  cyclobenzaprine (FLEXERIL) 10 MG tablet Take 10 mg by mouth 3 (three) times daily as needed for muscle spasms.    Historical Provider, MD  DULoxetine (CYMBALTA) 30 MG capsule Take 30 mg by mouth daily.    Historical Provider, MD  Multiple Vitamins-Minerals (MULTIVITAMINS THER. W/MINERALS) TABS Take 1 tablet by mouth daily.      Historical Provider, MD  traMADol (ULTRAM) 50 MG tablet Take 1 tablet (50 mg total) by mouth every 6 (six) hours as needed. 11/04/14   Layla Maw Ward, DO     Family History History reviewed. No pertinent family history.  Social History Social History  Substance Use Topics  . Smoking status: Never Smoker  . Smokeless tobacco: Not on file  . Alcohol use No     Allergies   Review of patient's allergies indicates no known allergies.   Review of Systems Review of Systems  Eyes: Positive for redness. Negative for pain, discharge and itching.  All other systems reviewed and are negative.    Physical Exam Updated Vital Signs BP 130/84 (BP Location: Left Arm)   Pulse 60   Temp 97.7 F (36.5 C) (Oral)   Resp 18   Ht 6\' 4"  (1.93 m)   Wt 122.5 kg   SpO2 100%   BMI 32.87 kg/m   Physical Exam  Constitutional: He is oriented to person, place, and time. He appears well-developed and well-nourished.  Non-toxic appearance.  HENT:  Head: Normocephalic.  Right Ear: Tympanic membrane and external ear normal.  Left Ear: Tympanic membrane and external ear normal.  Eyes: EOM and lids are normal. Pupils are equal, round, and reactive to light. Left conjunctiva is injected.  Fundoscopic exam:      The right eye shows no hemorrhage and no papilledema.  The left eye shows no hemorrhage and no papilledema.  Slit lamp exam:      The left eye shows fluorescein uptake. The left eye shows no corneal flare, no corneal ulcer, no foreign body and no hyphema.  Neck: Normal range of motion. Neck supple. Carotid bruit is not present.  Cardiovascular: Normal rate, regular rhythm, normal heart sounds, intact distal pulses and normal pulses.   Pulmonary/Chest: Breath sounds normal. No respiratory distress.  Abdominal: Soft. Bowel sounds are normal. There is no tenderness. There is no guarding.  Musculoskeletal: Normal range of motion.  Lymphadenopathy:       Head (right side): No submandibular adenopathy present.       Head (left side): No submandibular adenopathy present.    He has no cervical adenopathy.  Neurological: He is alert and oriented  to person, place, and time. He has normal strength. No cranial nerve deficit or sensory deficit.  Skin: Skin is warm and dry.  Psychiatric: He has a normal mood and affect. His speech is normal.  Nursing note and vitals reviewed.    ED Treatments / Results  Labs (all labs ordered are listed, but only abnormal results are displayed) Labs Reviewed - No data to display  EKG  EKG Interpretation None       Radiology Ct Orbits Wo Contrast  Result Date: 01/12/2016 CLINICAL DATA:  Patient has a sensation of foreign body in his left eye. EXAM: CT ORBITS WITHOUT CONTRAST TECHNIQUE: Multidetector CT imaging of the orbits was performed following the standard protocol without intravenous contrast. COMPARISON:  CT of the head 03/08/2013 FINDINGS: Orbits: No traumatic or inflammatory finding. Globes, optic nerves, orbital fat, extraocular muscles, vascular structures, and lacrimal glands are normal. No radiopaque foreign body seen. Visualized sinuses: Clear. Soft tissues: Negative. Limited intracranial: No significant or unexpected finding. IMPRESSION: Normal CT of the orbits. No radiopaque foreign bodies seen within the left orbit. Electronically Signed   By: Ted Mcalpineobrinka  Dimitrova M.D.   On: 01/12/2016 16:36    Procedures Procedures (including critical care time)  Medications Ordered in ED Medications  tetracaine (PONTOCAINE) 0.5 % ophthalmic solution 2 drop (2 drops Left Eye Given 01/12/16 1442)  Tdap (BOOSTRIX) injection 0.5 mL (0.5 mLs Intramuscular Given 01/12/16 1442)  tobramycin (TOBREX) 0.3 % ophthalmic solution 2 drop (2 drops Left Eye Given 01/12/16 1658)     Initial Impression / Assessment and Plan / ED Course  I have reviewed the triage vital signs and the nursing notes.  Pertinent labs & imaging results that were available during my care of the patient were reviewed by me and considered in my medical decision making (see chart for details).  Clinical Course    *I have reviewed  nursing notes, vital signs, and all appropriate lab and imaging results for this patient.**  Final Clinical Impressions(s) / ED Diagnoses  Vital signs within normal limits. I have reviewed the visual acuity. The CT scan is negative for foreign body in the globe or in the orbit area.  The patient will be treated with tobramycin 2 drops every 4 hours for the next 5 days. He is asked to return to the emergency department or see his eye specialist if not improving. The patient is in agreement with this discharge plan.    Final diagnoses:  Eye irritation    New Prescriptions New Prescriptions   No medications on file     Ivery QualeHobson Davide Risdon, PA-C 01/12/16 1706    Bethann BerkshireJoseph Zammit, MD 01/13/16 1302

## 2016-06-14 ENCOUNTER — Encounter (HOSPITAL_COMMUNITY): Payer: Self-pay | Admitting: Emergency Medicine

## 2016-06-14 ENCOUNTER — Emergency Department (HOSPITAL_COMMUNITY)
Admission: EM | Admit: 2016-06-14 | Discharge: 2016-06-14 | Disposition: A | Discharge status: Home / Self Care | Payer: Non-veteran care | Attending: Dermatology | Admitting: Dermatology

## 2016-06-14 DIAGNOSIS — M545 Low back pain: Secondary | ICD-10-CM | POA: Diagnosis present

## 2016-06-14 DIAGNOSIS — M549 Dorsalgia, unspecified: Secondary | ICD-10-CM

## 2016-06-14 DIAGNOSIS — Z5321 Procedure and treatment not carried out due to patient leaving prior to being seen by health care provider: Secondary | ICD-10-CM | POA: Insufficient documentation

## 2016-06-14 DIAGNOSIS — Z79899 Other long term (current) drug therapy: Secondary | ICD-10-CM | POA: Diagnosis not present

## 2016-06-14 LAB — URINALYSIS, ROUTINE W REFLEX MICROSCOPIC
Bilirubin Urine: NEGATIVE
Glucose, UA: NEGATIVE mg/dL
Hgb urine dipstick: NEGATIVE
KETONES UR: NEGATIVE mg/dL
LEUKOCYTES UA: NEGATIVE
NITRITE: NEGATIVE
PH: 6 (ref 5.0–8.0)
Protein, ur: NEGATIVE mg/dL
Specific Gravity, Urine: >1.03 — ABNORMAL HIGH (ref 1.005–1.030)

## 2016-06-14 NOTE — ED Notes (Signed)
Pt leaving AMA, says he has been here since 0430 and wasted his time.

## 2016-06-14 NOTE — ED Triage Notes (Signed)
Pt c/o lower back pain x 3 days. 

## 2016-11-03 ENCOUNTER — Emergency Department (HOSPITAL_COMMUNITY): Payer: Non-veteran care

## 2016-11-03 ENCOUNTER — Emergency Department (HOSPITAL_COMMUNITY)
Admission: EM | Admit: 2016-11-03 | Discharge: 2016-11-03 | Disposition: A | Discharge status: Home / Self Care | Payer: Non-veteran care | Attending: Emergency Medicine | Admitting: Emergency Medicine

## 2016-11-03 ENCOUNTER — Encounter (HOSPITAL_COMMUNITY): Payer: Self-pay | Admitting: Emergency Medicine

## 2016-11-03 DIAGNOSIS — Z23 Encounter for immunization: Secondary | ICD-10-CM | POA: Diagnosis not present

## 2016-11-03 DIAGNOSIS — Z79899 Other long term (current) drug therapy: Secondary | ICD-10-CM | POA: Insufficient documentation

## 2016-11-03 DIAGNOSIS — M791 Myalgia, unspecified site: Secondary | ICD-10-CM

## 2016-11-03 LAB — CBC
HCT: 38.2 % — ABNORMAL LOW (ref 39.0–52.0)
HEMOGLOBIN: 13 g/dL (ref 13.0–17.0)
MCH: 30 pg (ref 26.0–34.0)
MCHC: 34 g/dL (ref 30.0–36.0)
MCV: 88 fL (ref 78.0–100.0)
Platelets: 313 10*3/uL (ref 150–400)
RBC: 4.34 MIL/uL (ref 4.22–5.81)
RDW: 12.9 % (ref 11.5–15.5)
WBC: 9.4 10*3/uL (ref 4.0–10.5)

## 2016-11-03 LAB — BASIC METABOLIC PANEL
ANION GAP: 9 (ref 5–15)
BUN: 11 mg/dL (ref 6–20)
CHLORIDE: 106 mmol/L (ref 101–111)
CO2: 23 mmol/L (ref 22–32)
CREATININE: 1.08 mg/dL (ref 0.61–1.24)
Calcium: 8.8 mg/dL — ABNORMAL LOW (ref 8.9–10.3)
GFR calc non Af Amer: >60 mL/min (ref >60)
Glucose, Bld: 88 mg/dL (ref 65–99)
Potassium: 3.5 mmol/L (ref 3.5–5.1)
Sodium: 138 mmol/L (ref 135–145)

## 2016-11-03 LAB — TROPONIN I
Troponin I: <0.03 ng/mL (ref <0.03)
Troponin I: <0.03 ng/mL (ref <0.03)

## 2016-11-03 LAB — D-DIMER, QUANTITATIVE: D-Dimer, Quant: 0.32 ug/mL-FEU (ref 0.00–0.50)

## 2016-11-03 MED ORDER — DOXYCYCLINE HYCLATE 100 MG PO TABS
100.0000 mg | ORAL_TABLET | Freq: Once | ORAL | Status: AC
  Administered 2016-11-03: 100 mg via ORAL
  Filled 2016-11-03: qty 1

## 2016-11-03 MED ORDER — TETANUS-DIPHTH-ACELL PERTUSSIS 5-2.5-18.5 LF-MCG/0.5 IM SUSP
0.5000 mL | Freq: Once | INTRAMUSCULAR | Status: AC
  Administered 2016-11-03: 0.5 mL via INTRAMUSCULAR
  Filled 2016-11-03: qty 0.5

## 2016-11-03 MED ORDER — DOXYCYCLINE HYCLATE 100 MG PO CAPS: 100.0000 mg | ORAL_CAPSULE | Freq: Two times a day (BID) | ORAL | 0 refills | Status: DC

## 2016-11-03 NOTE — ED Provider Notes (Signed)
[facility] Provider Note   CSN: [id number] Arrival date & time: 11/03/16  1702     History   Chief Complaint Chief Complaint  Patient presents with  . Arm Pain    HPI Ethan Moore is a 56 y.o. male.   Arm Pain  This is a new problem. The current episode started 2 days ago. The problem occurs constantly. The problem has not changed since onset.Associated symptoms include chest pain. Nothing aggravates the symptoms.    Past Medical History:  Diagnosis Date  . Back injury   . Chronic back pain   . Chronic neck pain   . Dizziness   . Neck injury     There are no active problems to display for this patient.   History reviewed. No pertinent surgical history.     Home Medications    Prior to Admission medications   Medication Sig Start Date End Date Taking? Authorizing Provider  cetirizine (ZYRTEC) 10 MG tablet Take 10 mg by mouth daily.   Yes [provider]  cyclobenzaprine (FLEXERIL) 10 MG tablet Take 10 mg by mouth 3 (three) times daily as needed for muscle spasms.   Yes [provider]  DULoxetine (CYMBALTA) 30 MG capsule Take 30 mg by mouth daily.   Yes [provider]  Multiple Vitamins-Minerals (MULTIVITAMINS THER. W/MINERALS) TABS Take 1 tablet by mouth daily.     Yes [provider]  doxycycline (VIBRAMYCIN) 100 MG capsule Take 1 capsule (100 mg total) by mouth 2 (two) times daily. One po bid x 7 days 11/03/16   Eyvette Cordon, Ethan CowerJason, MD  traMADol (ULTRAM) 50 MG tablet Take 1 tablet (50 mg total) by mouth every 6 (six) hours as needed. Patient taking differently: Take 50 mg by mouth every 6 (six) hours as needed for moderate pain or severe pain.  11/04/14   Ward, Layla MawKristen N, DO    Family History History reviewed. No pertinent family history.  Social History Social History  Substance Use Topics  . Smoking status: Never Smoker  . Smokeless tobacco: Never Used  . Alcohol use No     Allergies   Patient has no known  allergies.   Review of Systems Review of Systems  Cardiovascular: Positive for chest pain.  All other systems reviewed and are negative.    Physical Exam Updated Vital Signs BP 119/88   Pulse 62   Temp 98.3 F (36.8 C) (Oral)   Resp 17   Ht 6\' 4"  (1.93 m)   Wt 117.9 kg (260 lb)   SpO2 100%   BMI 31.65 kg/m   Physical Exam  Constitutional: He appears well-developed and well-nourished.  HENT:  Head: Normocephalic and atraumatic.  Eyes: Conjunctivae and EOM are normal.  Neck: Normal range of motion.  Cardiovascular: Normal rate.   Pulmonary/Chest: Effort normal. No respiratory distress.  Abdominal: Soft. He exhibits no distension.  Musculoskeletal: Normal range of motion.  Neurological: He is alert. No cranial nerve deficit. Coordination normal.  Skin: Skin is warm and dry. No rash noted. No erythema.  Nursing note and vitals reviewed.    ED Treatments / Results  Labs (all labs ordered are listed, but only abnormal results are displayed) Labs Reviewed  BASIC METABOLIC PANEL - Abnormal; Notable for the following:       Result Value   Calcium 8.8 (*)    All other components within normal limits  CBC - Abnormal; Notable for the following:    HCT 38.2 (*)  All other components within normal limits  TROPONIN I  D-DIMER, QUANTITATIVE (NOT AT [facility])  TROPONIN I    EKG  EKG Interpretation  Date/Time:  Monday November 03 2016 17:32:03 EDT Ventricular Rate:  80 PR Interval:    QRS Duration: 101 QT Interval:  377 QTC Calculation: 435 R Axis:   79 Text Interpretation:  Sinus rhythm improved t wave changes since december 2016 Confirmed by Marily Memos [phone]) on 11/03/2016 5:37:12 PM       Radiology Dg Chest 2 View  Result Date: 11/03/2016 CLINICAL DATA:  Chest tightness. EXAM: CHEST  2 VIEW COMPARISON:  10/08/2014 FINDINGS: The lungs are clear without focal pneumonia, edema, pneumothorax or pleural effusion. The cardiopericardial silhouette is within normal limits  for size. The visualized bony structures of the thorax are intact. Telemetry leads overlie the chest. IMPRESSION: No active cardiopulmonary disease. Electronically Signed   By: Kennith Center M.D.   On: 11/03/2016 18:02    Procedures Procedures (including critical care time)  Medications Ordered in ED Medications  Tdap (BOOSTRIX) injection 0.5 mL (0.5 mLs Intramuscular Given 11/03/16 1830)  doxycycline (VIBRA-TABS) tablet 100 mg (100 mg Oral Given 11/03/16 1830)     Initial Impression / Assessment and Plan / ED Course  I have reviewed the triage vital signs and the nursing notes.  Pertinent labs & imaging results that were available during my care of the patient were reviewed by me and considered in my medical decision making (see chart for details).     Delta troponins and ECG ok, doubt ACS.  D dimer negative, doubt DVT or PE.  Did have multiple ticks a couple days prior to this and myalgias in his shoulders, will treat for tick borne illness.  Also mentions he stabbed himself with a piece of metal at work, tdap updated.   Final Clinical Impressions(s) / ED Diagnoses   Final diagnoses:  Myalgia    New Prescriptions Discharge Medication List as of 11/03/2016 10:28 PM    START taking these medications   Details  doxycycline (VIBRAMYCIN) 100 MG capsule Take 1 capsule (100 mg total) by mouth 2 (two) times daily. One po bid x 7 days, Starting Mon 11/03/2016, Print         Ethan Moore, Ethan Cower, MD 11/04/16 [phone]

## 2016-11-03 NOTE — ED Triage Notes (Addendum)
Patient complaining of left arm pain x 4 days. Also states he had some chest tightness over the weekends. Denies injury. 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

## 2016-11-16 ENCOUNTER — Emergency Department (HOSPITAL_COMMUNITY)
Admission: EM | Admit: 2016-11-16 | Discharge: 2016-11-16 | Disposition: A | Discharge status: Home / Self Care | Payer: Non-veteran care | Attending: Emergency Medicine | Admitting: Emergency Medicine

## 2016-11-16 ENCOUNTER — Emergency Department (HOSPITAL_COMMUNITY): Payer: Non-veteran care

## 2016-11-16 ENCOUNTER — Encounter (HOSPITAL_COMMUNITY): Payer: Self-pay | Admitting: Emergency Medicine

## 2016-11-16 DIAGNOSIS — R059 Cough, unspecified: Secondary | ICD-10-CM

## 2016-11-16 DIAGNOSIS — R05 Cough: Secondary | ICD-10-CM | POA: Insufficient documentation

## 2016-11-16 LAB — URINALYSIS, ROUTINE W REFLEX MICROSCOPIC
Bilirubin Urine: NEGATIVE
Glucose, UA: NEGATIVE mg/dL
Hgb urine dipstick: NEGATIVE
Ketones, ur: NEGATIVE mg/dL
LEUKOCYTES UA: NEGATIVE
Nitrite: NEGATIVE
PH: 6 (ref 5.0–8.0)
PROTEIN: NEGATIVE mg/dL
SPECIFIC GRAVITY, URINE: 1.025 (ref 1.005–1.030)

## 2016-11-16 MED ORDER — BENZONATATE 100 MG PO CAPS: 100.0000 mg | ORAL_CAPSULE | Freq: Three times a day (TID) | ORAL | 0 refills | Status: DC

## 2016-11-16 MED ORDER — ALBUTEROL SULFATE HFA 108 (90 BASE) MCG/ACT IN AERS: 2.0000 | INHALATION_SPRAY | RESPIRATORY_TRACT | 0 refills | Status: DC | PRN

## 2016-11-16 MED ORDER — PREDNISONE 10 MG PO TABS: 40.0000 mg | ORAL_TABLET | Freq: Every day | ORAL | 0 refills | Status: AC

## 2016-11-16 MED ORDER — PREDNISONE 50 MG PO TABS
60.0000 mg | ORAL_TABLET | Freq: Once | ORAL | Status: AC
  Administered 2016-11-16: 60 mg via ORAL
  Filled 2016-11-16: qty 1

## 2016-11-16 NOTE — ED Provider Notes (Signed)
[facility] DEPT Provider Note   CSN: [id number] Arrival date & time: 11/16/16  1244     History   Chief Complaint Chief Complaint  Patient presents with  . Cough    HPI Ethan Moore is a 56 y.o. male.  HPI    Ethan Moore is a 56 y.o. male, with a history of Chronic back pain, presenting to the ED with Persistent cough and congestion for the last 2 weeks. Patient works in a dusty environment and only intermittently wears his respiratory protection. He thinks this may be contributing to his symptoms.  Patient also endorses a feeling of swelling and soreness to the left flank and left lower back over the same time period. He states the sensation is more discomfort than pain and that it is minor. He states this came on after doing some unusually heavy lifting. Was seen on July 2 without abnormalities in lab work or x-ray noted.  Requesting urine testing for infection.   Denies fevers/chills, shortness of breath, dysuria, hematuria, abdominal pain, penile discharge, N/V/C/D, CP, or any other complaints.   Patient states he has a PCP appt at the [facility] on July 28.  Past Medical History:  Diagnosis Date  . Back injury   . Chronic back pain   . Chronic neck pain   . Dizziness   . Neck injury     There are no active problems to display for this patient.   History reviewed. No pertinent surgical history.     Home Medications    Prior to Admission medications   Medication Sig Start Date End Date Taking? Authorizing Provider  albuterol (PROVENTIL HFA;VENTOLIN HFA) 108 (90 Base) MCG/ACT inhaler Inhale 2 puffs into the lungs every 4 (four) hours as needed for wheezing or shortness of breath. 11/16/16   Joy, Shawn C, PA-C  benzonatate (TESSALON) 100 MG capsule Take 1 capsule (100 mg total) by mouth every 8 (eight) hours. 11/16/16   Joy, Shawn C, PA-C  cetirizine (ZYRTEC) 10 MG tablet Take 10 mg by mouth daily.    [provider]  cyclobenzaprine (FLEXERIL) 10 MG tablet  Take 10 mg by mouth 3 (three) times daily as needed for muscle spasms.    [provider]  doxycycline (VIBRAMYCIN) 100 MG capsule Take 1 capsule (100 mg total) by mouth 2 (two) times daily. One po bid x 7 days 11/03/16   Mesner, Barbara Cower, MD  DULoxetine (CYMBALTA) 30 MG capsule Take 30 mg by mouth daily.    [provider]  Multiple Vitamins-Minerals (MULTIVITAMINS THER. W/MINERALS) TABS Take 1 tablet by mouth daily.      [provider]  predniSONE (DELTASONE) 10 MG tablet Take 4 tablets (40 mg total) by mouth daily with breakfast. 11/16/16 11/21/16  Joy, Shawn C, PA-C  traMADol (ULTRAM) 50 MG tablet Take 1 tablet (50 mg total) by mouth every 6 (six) hours as needed. Patient taking differently: Take 50 mg by mouth every 6 (six) hours as needed for moderate pain or severe pain.  11/04/14   Ward, Layla Maw, DO    Family History History reviewed. No pertinent family history.  Social History Social History  Substance Use Topics  . Smoking status: Never Smoker  . Smokeless tobacco: Never Used  . Alcohol use No     Allergies   Patient has no known allergies.   Review of Systems Review of Systems  Constitutional: Negative for chills and fever.  HENT: Positive for congestion and rhinorrhea. Negative for ear  pain, sore throat and trouble swallowing.   Respiratory: Positive for cough. Negative for shortness of breath.   Cardiovascular: Negative for chest pain.  Gastrointestinal: Negative for abdominal pain, constipation, diarrhea, nausea and vomiting.  Genitourinary: Negative for dysuria and hematuria.  Musculoskeletal: Positive for back pain. Negative for neck pain.  Neurological: Negative for dizziness, syncope, weakness, light-headedness, numbness and headaches.  All other systems reviewed and are negative.    Physical Exam Updated Vital Signs BP (!) 143/96 (BP Location: Right Arm)   Pulse 62   Temp 98.1 F (36.7 C) (Oral)   Resp 18   Ht 6\' 4"  (1.93 m)   Wt  117.9 kg (260 lb)   SpO2 100%   BMI 31.65 kg/m   Physical Exam  Constitutional: He appears well-developed and well-nourished. No distress.  HENT:  Head: Normocephalic and atraumatic.  Mouth/Throat: Oropharynx is clear and moist.  Eyes: Conjunctivae are normal.  Neck: Neck supple.  Cardiovascular: Normal rate, regular rhythm, normal heart sounds and intact distal pulses.   Pulmonary/Chest: Effort normal and breath sounds normal. No respiratory distress. He exhibits no tenderness.  No increased work of breathing. Patient speaks in full sentences without difficulty.  Abdominal: Soft. There is no tenderness. There is no guarding and no CVA tenderness.  Musculoskeletal: He exhibits no edema or tenderness.  No known tenderness or swelling anywhere on the torso.  Lymphadenopathy:    He has no cervical adenopathy.  Neurological: He is alert.  Skin: Skin is warm and dry. He is not diaphoretic.  Psychiatric: He has a normal mood and affect. His behavior is normal.  Nursing note and vitals reviewed.    ED Treatments / Results  Labs (all labs ordered are listed, but only abnormal results are displayed) Labs Reviewed  URINALYSIS, ROUTINE W REFLEX MICROSCOPIC    EKG  EKG Interpretation None       Radiology Dg Chest 2 View  Result Date: 11/16/2016 CLINICAL DATA:  Dry cough for 2 days. EXAM: CHEST  2 VIEW COMPARISON:  PA and lateral chest 11/03/2016 and 10/08/2014. FINDINGS: The lungs are clear. Heart size is normal. No pneumothorax or pleural fluid. No bony abnormality. IMPRESSION: Negative chest. Electronically Signed   By: Drusilla Kannerhomas  Dalessio M.D.   On: 11/16/2016 13:32    Procedures Procedures (including critical care time)  Medications Ordered in ED Medications  predniSONE (DELTASONE) tablet 60 mg (60 mg Oral Given 11/16/16 1418)     Initial Impression / Assessment and Plan / ED Course  I have reviewed the triage vital signs and the nursing notes.  Pertinent labs & imaging  results that were available during my care of the patient were reviewed by me and considered in my medical decision making (see chart for details).     Patient presents primarily with complaint of congestion and cough. Patient is nontoxic appearing, afebrile, not tachycardic, not tachypneic, not hypotensive, maintains SPO2 of 100% on room air, and is in no apparent distress. Patient has no signs of sepsis or other serious or life-threatening condition. The patient was given instructions for home care as well as return precautions. Patient voices understanding of these instructions, accepts the plan, and is comfortable with discharge.   Vitals:   11/16/16 1259 11/16/16 1300 11/16/16 1448  BP: (!) 143/96  (!) 125/92  Pulse: 62  (!) 55  Resp: 18  16  Temp: 98.1 F (36.7 C)    TempSrc: Oral    SpO2: 100%  100%  Weight:  117.9 kg (  260 lb)   Height:  6\' 4"  (1.93 m)      Final Clinical Impressions(s) / ED Diagnoses   Final diagnoses:  Cough    New Prescriptions Discharge Medication List as of 11/16/2016  2:43 PM    START taking these medications   Details  albuterol (PROVENTIL HFA;VENTOLIN HFA) 108 (90 Base) MCG/ACT inhaler Inhale 2 puffs into the lungs every 4 (four) hours as needed for wheezing or shortness of breath., Starting Sun 11/16/2016, Print    benzonatate (TESSALON) 100 MG capsule Take 1 capsule (100 mg total) by mouth every 8 (eight) hours., Starting Sun 11/16/2016, Print    predniSONE (DELTASONE) 10 MG tablet Take 4 tablets (40 mg total) by mouth daily with breakfast., Starting Sun 11/16/2016, Until Fri 11/21/2016, Print         Joy, Shawn C, PA-C 11/17/16 0945    Donnetta Hutching, MD 11/18/16 [phone]

## 2016-11-16 NOTE — ED Triage Notes (Signed)
Patient c/o congested cough that started around the beginning of month. Per patient was seen here on 7/2 for swelling in left side of body. Patient states had informed doctor of cough then but doesn't remember getting chest x-ray. Did have blood work and EKG. Patient states he received a t-dap vaccination and antibiotics while here. Patient states no real decrease in swelling and increase in frequency of cough. Per patient he has the same symptoms once a year and gets diagnosed with bronchitis and usually gets prednisone. Denies chest pain but states soreness in chest with cough and sore throat.

## 2016-11-16 NOTE — ED Notes (Signed)
Pt reports many complaints- arm and shoulder pain - " I know my joint isn't quite in", R leg pain- "Imight have pulled something", a cough- non productive- "I feel like it is bronchitis"- And weakness- "I know it is a symptom":  [facility]

## 2016-11-16 NOTE — Discharge Instructions (Signed)
The urine and chest xray were without abnormalities.  Your symptoms are consistent with a viral illness, but environmental factors may also be a cause. Viruses do not require antibiotics. Treatment is symptomatic care and it is important to note that these symptoms may last for 7-14 days.   Hand washing: Wash your hands throughout the day, but especially before and after touching the face, using the restroom, sneezing, coughing, or touching surfaces that have been coughed or sneezed upon. Hydration: Symptoms will be intensified and complicated by dehydration. Dehydration can also extend the duration of symptoms. Drink plenty of fluids and get plenty of rest. You should be drinking at least half a liter of water an hour to stay hydrated. Electrolyte drinks are also encouraged. You should be drinking enough fluids to make your urine light yellow, almost clear. If this is not the case, you are not drinking enough water. Please note that some of the treatments indicated below will not be effective if you are not adequately hydrated. Pain or fever: Ibuprofen, Naproxen, or Tylenol for pain or fever.  Cough: Use the Tessalon for cough.  Congestion: Plain Mucinex may help relieve congestion. Saline sinus rinses and saline nasal sprays may also help relieve congestion. If you do not have heart problems or an allergy to such medications, you may also try phenylephrine or Sudafed. Albuterol: May use the albuterol inhaler, as needed, for minor shortness of breath. Sore throat: Warm liquids or Chloraseptic spray may help soothe a sore throat. Gargle twice a day with a salt water solution made from a half teaspoon of salt in a cup of warm water.  Follow up: Follow up with a primary care provider, as needed, for any future management of this issue. You may benefit from a powered respirator at work. This is a battery powered device that makes breathing a little easier while wearing the respirator.

## 2017-01-23 ENCOUNTER — Emergency Department (HOSPITAL_COMMUNITY)
Admission: EM | Admit: 2017-01-23 | Discharge: 2017-01-23 | Disposition: A | Discharge status: Home / Self Care | Payer: Non-veteran care | Attending: Emergency Medicine | Admitting: Emergency Medicine

## 2017-01-23 ENCOUNTER — Encounter (HOSPITAL_COMMUNITY): Payer: Self-pay

## 2017-01-23 DIAGNOSIS — Z5321 Procedure and treatment not carried out due to patient leaving prior to being seen by health care provider: Secondary | ICD-10-CM | POA: Insufficient documentation

## 2017-01-23 DIAGNOSIS — M25519 Pain in unspecified shoulder: Secondary | ICD-10-CM | POA: Insufficient documentation

## 2017-01-23 DIAGNOSIS — M542 Cervicalgia: Secondary | ICD-10-CM | POA: Insufficient documentation

## 2017-01-23 HISTORY — DX: Depression, unspecified: F32.A

## 2017-01-23 HISTORY — DX: Post-traumatic stress disorder, unspecified: F43.10

## 2017-01-23 HISTORY — DX: Major depressive disorder, single episode, unspecified: F32.9

## 2017-01-23 NOTE — ED Triage Notes (Addendum)
Patient c/o right arm numbness x 1 week. Patient states it feels numb when he has his arm still. Patient also c/o right neck and right shoulder pain.

## 2017-01-23 NOTE — ED Notes (Signed)
Pt states that he did not wish to wait any longer to be seen and was going to the [place].

## 2017-02-10 ENCOUNTER — Encounter (HOSPITAL_COMMUNITY): Payer: Self-pay | Admitting: Emergency Medicine

## 2017-02-10 ENCOUNTER — Observation Stay (HOSPITAL_COMMUNITY)
Admission: EM | Admit: 2017-02-10 | Discharge: 2017-02-12 | Disposition: A | Discharge status: Home / Self Care | Payer: Non-veteran care | Attending: Internal Medicine | Admitting: Internal Medicine

## 2017-02-10 ENCOUNTER — Emergency Department (HOSPITAL_COMMUNITY): Payer: Non-veteran care

## 2017-02-10 DIAGNOSIS — R251 Tremor, unspecified: Secondary | ICD-10-CM | POA: Diagnosis not present

## 2017-02-10 DIAGNOSIS — F1410 Cocaine abuse, uncomplicated: Secondary | ICD-10-CM

## 2017-02-10 DIAGNOSIS — R259 Unspecified abnormal involuntary movements: Secondary | ICD-10-CM

## 2017-02-10 DIAGNOSIS — R55 Syncope and collapse: Secondary | ICD-10-CM | POA: Insufficient documentation

## 2017-02-10 DIAGNOSIS — R209 Unspecified disturbances of skin sensation: Secondary | ICD-10-CM

## 2017-02-10 DIAGNOSIS — E860 Dehydration: Secondary | ICD-10-CM | POA: Diagnosis not present

## 2017-02-10 DIAGNOSIS — Z79899 Other long term (current) drug therapy: Secondary | ICD-10-CM | POA: Insufficient documentation

## 2017-02-10 DIAGNOSIS — F17210 Nicotine dependence, cigarettes, uncomplicated: Secondary | ICD-10-CM | POA: Diagnosis not present

## 2017-02-10 DIAGNOSIS — G259 Extrapyramidal and movement disorder, unspecified: Secondary | ICD-10-CM

## 2017-02-10 DIAGNOSIS — R202 Paresthesia of skin: Secondary | ICD-10-CM | POA: Diagnosis not present

## 2017-02-10 LAB — DIFFERENTIAL
BASOS ABS: 0 10*3/uL (ref 0.0–0.1)
BASOS PCT: 0 %
EOS ABS: 0 10*3/uL (ref 0.0–0.7)
Eosinophils Relative: 0 %
LYMPHS ABS: 1.4 10*3/uL (ref 0.7–4.0)
Lymphocytes Relative: 13 %
MONOS PCT: 7 %
Monocytes Absolute: 0.7 10*3/uL (ref 0.1–1.0)
NEUTROS ABS: 8.3 10*3/uL — AB (ref 1.7–7.7)
NEUTROS PCT: 80 %

## 2017-02-10 LAB — ETHANOL: Alcohol, Ethyl (B): <10 mg/dL (ref <10)

## 2017-02-10 LAB — RAPID URINE DRUG SCREEN, HOSP PERFORMED
AMPHETAMINES: NOT DETECTED
BENZODIAZEPINES: NOT DETECTED
Barbiturates: NOT DETECTED
COCAINE: POSITIVE — AB
Opiates: NOT DETECTED
Tetrahydrocannabinol: NOT DETECTED

## 2017-02-10 LAB — URINALYSIS, ROUTINE W REFLEX MICROSCOPIC
BACTERIA UA: NONE SEEN
Bilirubin Urine: NEGATIVE
GLUCOSE, UA: NEGATIVE mg/dL
Hgb urine dipstick: NEGATIVE
KETONES UR: NEGATIVE mg/dL
Nitrite: NEGATIVE
PROTEIN: NEGATIVE mg/dL
RBC / HPF: NONE SEEN RBC/hpf (ref 0–5)
Specific Gravity, Urine: 1.016 (ref 1.005–1.030)
pH: 5 (ref 5.0–8.0)

## 2017-02-10 LAB — CBC
HEMATOCRIT: 42.6 % (ref 39.0–52.0)
HEMOGLOBIN: 14.4 g/dL (ref 13.0–17.0)
MCH: 31 pg (ref 26.0–34.0)
MCHC: 33.8 g/dL (ref 30.0–36.0)
MCV: 91.6 fL (ref 78.0–100.0)
Platelets: 336 10*3/uL (ref 150–400)
RBC: 4.65 MIL/uL (ref 4.22–5.81)
RDW: 13.1 % (ref 11.5–15.5)
WBC: 10.4 10*3/uL (ref 4.0–10.5)

## 2017-02-10 LAB — I-STAT TROPONIN, ED
TROPONIN I, POC: 0 ng/mL (ref 0.00–0.08)
Troponin i, poc: 0 ng/mL (ref 0.00–0.08)

## 2017-02-10 LAB — COMPREHENSIVE METABOLIC PANEL
ALBUMIN: 4.2 g/dL (ref 3.5–5.0)
ALT: 20 U/L (ref 17–63)
AST: 21 U/L (ref 15–41)
Alkaline Phosphatase: 95 U/L (ref 38–126)
Anion gap: 7 (ref 5–15)
BILIRUBIN TOTAL: 1 mg/dL (ref 0.3–1.2)
BUN: 12 mg/dL (ref 6–20)
CHLORIDE: 101 mmol/L (ref 101–111)
CO2: 26 mmol/L (ref 22–32)
CREATININE: 1.32 mg/dL — AB (ref 0.61–1.24)
Calcium: 8.8 mg/dL — ABNORMAL LOW (ref 8.9–10.3)
GFR calc non Af Amer: 59 mL/min — ABNORMAL LOW (ref >60)
Glucose, Bld: 116 mg/dL — ABNORMAL HIGH (ref 65–99)
POTASSIUM: 3.3 mmol/L — AB (ref 3.5–5.1)
Sodium: 134 mmol/L — ABNORMAL LOW (ref 135–145)
Total Protein: 7.8 g/dL (ref 6.5–8.1)

## 2017-02-10 LAB — APTT: APTT: 31 s (ref 24–36)

## 2017-02-10 LAB — PROTIME-INR
INR: 0.94
Prothrombin Time: 12.5 seconds (ref 11.4–15.2)

## 2017-02-10 LAB — TSH: TSH: 0.463 u[IU]/mL (ref 0.350–4.500)

## 2017-02-10 LAB — GLUCOSE, CAPILLARY: GLUCOSE-CAPILLARY: 111 mg/dL — AB (ref 65–99)

## 2017-02-10 MED ORDER — ACETAMINOPHEN 650 MG RE SUPP: 650.0000 mg | Freq: Four times a day (QID) | RECTAL | Status: DC | PRN

## 2017-02-10 MED ORDER — SENNOSIDES-DOCUSATE SODIUM 8.6-50 MG PO TABS
1.0000 | ORAL_TABLET | Freq: Every evening | ORAL | Status: DC | PRN
  Administered 2017-02-11: 1 via ORAL
  Filled 2017-02-10: qty 1

## 2017-02-10 MED ORDER — ENOXAPARIN SODIUM 60 MG/0.6ML ~~LOC~~ SOLN
50.0000 mg | SUBCUTANEOUS | Status: DC
  Administered 2017-02-11: 50 mg via SUBCUTANEOUS
  Filled 2017-02-10: qty 0.6

## 2017-02-10 MED ORDER — CYCLOBENZAPRINE HCL 10 MG PO TABS: 10.0000 mg | ORAL_TABLET | Freq: Three times a day (TID) | ORAL | Status: DC | PRN

## 2017-02-10 MED ORDER — LORATADINE 10 MG PO TABS
10.0000 mg | ORAL_TABLET | Freq: Every day | ORAL | Status: DC
  Administered 2017-02-10 – 2017-02-12 (×3): 10 mg via ORAL
  Filled 2017-02-10 (×3): qty 1

## 2017-02-10 MED ORDER — ADULT MULTIVITAMIN W/MINERALS CH
1.0000 | ORAL_TABLET | Freq: Every day | ORAL | Status: DC
  Administered 2017-02-10 – 2017-02-12 (×3): 1 via ORAL
  Filled 2017-02-10 (×3): qty 1

## 2017-02-10 MED ORDER — ONDANSETRON HCL 4 MG PO TABS: 4.0000 mg | ORAL_TABLET | Freq: Four times a day (QID) | ORAL | Status: DC | PRN

## 2017-02-10 MED ORDER — ATORVASTATIN CALCIUM 10 MG PO TABS: 10.0000 mg | ORAL_TABLET | Freq: Every day | ORAL | 0 refills | Status: DC

## 2017-02-10 MED ORDER — ASPIRIN EC 325 MG PO TBEC
325.0000 mg | DELAYED_RELEASE_TABLET | Freq: Every day | ORAL | Status: DC
  Administered 2017-02-10 – 2017-02-12 (×3): 325 mg via ORAL
  Filled 2017-02-10 (×3): qty 1

## 2017-02-10 MED ORDER — ACETAMINOPHEN 325 MG PO TABS
650.0000 mg | ORAL_TABLET | Freq: Four times a day (QID) | ORAL | Status: DC | PRN
  Administered 2017-02-10: 650 mg via ORAL
  Filled 2017-02-10: qty 2

## 2017-02-10 MED ORDER — ONDANSETRON HCL 4 MG/2ML IJ SOLN: 4.0000 mg | Freq: Four times a day (QID) | INTRAMUSCULAR | Status: DC | PRN

## 2017-02-10 MED ORDER — ASPIRIN EC 325 MG PO TBEC: 325.0000 mg | DELAYED_RELEASE_TABLET | Freq: Every day | ORAL | 0 refills | Status: DC

## 2017-02-10 MED ORDER — DULOXETINE HCL 30 MG PO CPEP: 30.0000 mg | ORAL_CAPSULE | Freq: Every day | ORAL | Status: DC | PRN

## 2017-02-10 MED ORDER — THIAMINE HCL 100 MG/ML IJ SOLN
Freq: Once | INTRAVENOUS | Status: AC
  Administered 2017-02-10: 14:00:00 via INTRAVENOUS
  Filled 2017-02-10: qty 1000

## 2017-02-10 NOTE — ED Triage Notes (Signed)
Pt C/O left facial tingling and right arm numbness that started about 0200 this morning.

## 2017-02-10 NOTE — ED Notes (Signed)
This nurse unable to do orthotsatic VS at this time. Pt is sleeping and when awake pt was dizzy and unstable.

## 2017-02-10 NOTE — ED Provider Notes (Signed)
After patient was discharged, he had episode of diaphoresis, reportedly his eyes were deviated and "twitching" per nurse.  He also had difficulty speaking  When I entered room he was diaphoretic, but was more alert and talkative.  He is now feeling improved   EKG Interpretation  Date/Time:  Tuesday February 10 2017 07:04:32 EDT Ventricular Rate:  68 PR Interval:    QRS Duration: 100 QT Interval:  441 QTC Calculation: 469 R Axis:   94 Text Interpretation:  Sinus rhythm Probable left atrial enlargement Borderline right axis deviation Probable anteroseptal infarct, old No significant change since last tracing Confirmed by Zadie Rhine ([id number]) on 02/10/2017 7:08:07 AM      Repeat EKG unchanged  Will have hospitalist evaluate for admission D/w dr Ardyth Harps for admission    Zadie Rhine, MD 02/10/17 [phone]

## 2017-02-10 NOTE — ED Notes (Signed)
When being discharged by previous nurse, pt began to become dizzy and diaphoretic. Pt had eyes deviated to the left. His hand were turned inward and were twitching. Pt would not make eye contact with nurse.   CBG 111.
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
## 2017-02-10 NOTE — Consult Note (Addendum)
Monona A. Merlene Laughter, MD     [email or web address]          Ethan Moore is an 56 y.o. male.   ASSESSMENT/PLAN: 1. The patient has multiple symptoms that are neurological without a clear unifying diagnosis.  Potential areas of concerns include seizures and focal neuropathy given the apparent negative imaging.    Reversible cerebral ischemia from cocaine is also a possibility. The patient should have an EEG.  He also should have a nerve conduction and the needle electromyography for further evaluation.  This will have to be done in outpatient setting.   Heavy metal screen is also suggested given his suspicion of being poisoned.         The patient is a 56 year old right-handed black male who has had numbness and tingling of the upper extremities over the last month.  It appears that the symptoms started on the left side and then occurred sometime on the right side.  He is right-handed.  The patient reports that he developed left facial numbness and this prompted him to seek medical attention.  He was seen at a hospital in [place].  He had extensive workup.  The workup has been reviewed by the hospitalist and Emergency Room physician.  The report indicated that he had a MRI which was normal showing no acute stroke.  Echo and the CT of the head and neck were also unrevealing.  Patient decided to present to the emergency room here with recurrence symptoms involving numbness of the left upper extremity and facial numbness.  He was noted to be diaphoretic on evaluation emergency room and was admitted for further evaluation.  He reports having episodes of what appears to be flexion spasms of the upper extremities and sometimes the legs.  He reports the unusual sensation as if he is being electrocuted.  He reports having tinnitus bilaterally and jerky movements bilaterally but not associated with loss of consciousness.  There is no personal history of seizures.  There is no family  history of seizures. He reports that he believes that he is being purpose of the poisoned by other people putting things in his strength including possible cocaine.  He tells me that he often becomes quite drowsy and unresponsive when he hangs out with his friends and leaves his drink out.  The patient does not report being a  Habitual user of cocaine.  He has had significant psychological stresses with his wife who died approximately over a year ago. The review of systems otherwise negative.      GENERAL:   This is a pleasant male who is in no acute distress.  HEENT:   He does have a crowded posterior air space.  Neck is supple.  ABDOMEN: soft  EXTREMITIES: No edema ; he has a significant Tinel sign at the right wrist.  I did not elicit a sign on the left side.   BACK:  This is normal.  SKIN: Normal by inspection.    MENTAL STATUS: Alert and oriented. Speech, language and cognition are generally intact. Judgment and insight normal.   CRANIAL NERVES: Pupils are equal, round and reactive to light and accomodation; extra ocular movements are full, there is no significant nystagmus; visual fields are full; upper and lower facial muscles are normal in strength and symmetric, there is no flattening of the nasolabial folds; tongue is midline; uvula is midline; shoulder elevation is normal.  MOTOR: Normal tone, bulk and strength; no pronator drift.  COORDINATION: Left finger  to nose is normal, right finger to nose is normal, No rest tremor; no intention tremor; no postural tremor; no bradykinesia.  REFLEXES: Deep tendon reflexes are symmetrical and normal. Babinski reflexes are flexor bilaterally.   SENSATION: Normal to light touch, temperature, and pinprick.       Blood pressure 124/84, pulse 90, temperature 99.8 F (37.7 C), temperature source Oral, resp. rate 16, height _0  (1.93 m), weight 250 lb 11.2 oz (113.7 kg), SpO2 99 %.  Past Medical History:  Diagnosis Date  . Back injury    . Chronic back pain   . Chronic neck pain   . Depression   . Dizziness   . Neck injury   . PTSD (post-traumatic stress disorder)     History reviewed. No pertinent surgical history.  History reviewed. No pertinent family history.  Social History:  reports that he has been smoking Cigarettes.  He has never used smokeless tobacco. He reports that he drinks alcohol. He reports that he does not use drugs.  Allergies: No Known Allergies  Medications: Prior to Admission medications   Medication Sig Start Date End Date Taking? Authorizing Provider  aspirin EC 325 MG tablet Take 325 mg by mouth daily.   Yes [provider]  cetirizine (ZYRTEC) 10 MG tablet Take 10 mg by mouth daily.   Yes [provider]  cyclobenzaprine (FLEXERIL) 10 MG tablet Take 10 mg by mouth 3 (three) times daily as needed for muscle spasms.   Yes [provider]  diclofenac (VOLTAREN) 75 MG EC tablet Take 75 mg by mouth 2 (two) times daily.    Yes [provider]  DULoxetine (CYMBALTA) 30 MG capsule Take 30 mg by mouth daily as needed (PTSD).    Yes [provider]  Multiple Vitamins-Minerals (MULTIVITAMINS THER. W/MINERALS) TABS Take 1 tablet by mouth daily.     Yes [provider]  aspirin EC 325 MG tablet Take 1 tablet (325 mg total) by mouth daily. 02/10/17   Ripley Fraise, MD  atorvastatin (LIPITOR) 10 MG tablet Take 1 tablet (10 mg total) by mouth daily. 02/10/17   Ripley Fraise, MD    Scheduled Meds: . aspirin EC  325 mg Oral Daily  . enoxaparin (LOVENOX) injection  50 mg Subcutaneous Q24H  . loratadine  10 mg Oral Daily  . multivitamin with minerals  1 tablet Oral Daily   Continuous Infusions: PRN Meds:.acetaminophen **OR** acetaminophen, cyclobenzaprine, DULoxetine, ondansetron **OR** ondansetron (ZOFRAN) IV, senna-docusate     Results for orders placed or performed during the hospital encounter of 02/10/17 (from the past 48 hour(s))    Ethanol     Status: None   Collection Time: 02/10/17  4:25 AM  Result Value Ref Range   Alcohol, Ethyl (B) <10 <10 mg/dL    Comment:        LOWEST DETECTABLE LIMIT FOR SERUM ALCOHOL IS 10 mg/dL FOR MEDICAL PURPOSES ONLY Please note change in reference range.   Protime-INR     Status: None   Collection Time: 02/10/17  4:25 AM  Result Value Ref Range   Prothrombin Time 12.5 11.4 - 15.2 seconds   INR 0.94   APTT     Status: None   Collection Time: 02/10/17  4:25 AM  Result Value Ref Range   aPTT 31 24 - 36 seconds  CBC     Status: None   Collection Time: 02/10/17  4:25 AM  Result Value Ref Range   WBC 10.4 4.0 - 10.5 K/uL  RBC 4.65 4.22 - 5.81 MIL/uL   Hemoglobin 14.4 13.0 - 17.0 g/dL   HCT 42.6 39.0 - 52.0 %   MCV 91.6 78.0 - 100.0 fL   MCH 31.0 26.0 - 34.0 pg   MCHC 33.8 30.0 - 36.0 g/dL   RDW 13.1 11.5 - 15.5 %   Platelets 336 150 - 400 K/uL  Differential     Status: Abnormal   Collection Time: 02/10/17  4:25 AM  Result Value Ref Range   Neutrophils Relative % 80 %   Neutro Abs 8.3 (H) 1.7 - 7.7 K/uL   Lymphocytes Relative 13 %   Lymphs Abs 1.4 0.7 - 4.0 K/uL   Monocytes Relative 7 %   Monocytes Absolute 0.7 0.1 - 1.0 K/uL   Eosinophils Relative 0 %   Eosinophils Absolute 0.0 0.0 - 0.7 K/uL   Basophils Relative 0 %   Basophils Absolute 0.0 0.0 - 0.1 K/uL  Comprehensive metabolic panel     Status: Abnormal   Collection Time: 02/10/17  4:25 AM  Result Value Ref Range   Sodium 134 (L) 135 - 145 mmol/L   Potassium 3.3 (L) 3.5 - 5.1 mmol/L   Chloride 101 101 - 111 mmol/L   CO2 26 22 - 32 mmol/L   Glucose, Bld 116 (H) 65 - 99 mg/dL   BUN 12 6 - 20 mg/dL   Creatinine, Ser 1.32 (H) 0.61 - 1.24 mg/dL   Calcium 8.8 (L) 8.9 - 10.3 mg/dL   Total Protein 7.8 6.5 - 8.1 g/dL   Albumin 4.2 3.5 - 5.0 g/dL   AST 21 15 - 41 U/L   ALT 20 17 - 63 U/L   Alkaline Phosphatase 95 38 - 126 U/L   Total Bilirubin 1.0 0.3 - 1.2 mg/dL   GFR calc non Af Amer 59 (L) >60 mL/min   GFR  calc Af Amer >60 >60 mL/min    Comment: (NOTE) The eGFR has been calculated using the CKD EPI equation. This calculation has not been validated in all clinical situations. eGFR's persistently <60 mL/min signify possible Chronic Kidney Disease.    Anion gap 7 5 - 15  I-stat troponin, ED     Status: None   Collection Time: 02/10/17  4:37 AM  Result Value Ref Range   Troponin i, poc 0.00 0.00 - 0.08 ng/mL   Comment 3            Comment: Due to the release kinetics of cTnI, a negative result within the first hours of the onset of symptoms does not rule out myocardial infarction with certainty. If myocardial infarction is still suspected, repeat the test at appropriate intervals.   Urine rapid drug screen (hosp performed)     Status: Abnormal   Collection Time: 02/10/17  5:12 AM  Result Value Ref Range   Opiates NONE DETECTED NONE DETECTED   Cocaine POSITIVE (A) NONE DETECTED   Benzodiazepines NONE DETECTED NONE DETECTED   Amphetamines NONE DETECTED NONE DETECTED   Tetrahydrocannabinol NONE DETECTED NONE DETECTED   Barbiturates NONE DETECTED NONE DETECTED    Comment:        DRUG SCREEN FOR MEDICAL PURPOSES ONLY.  IF CONFIRMATION IS NEEDED FOR ANY PURPOSE, NOTIFY LAB WITHIN 5 DAYS.        LOWEST DETECTABLE LIMITS FOR URINE DRUG SCREEN Drug Class       Cutoff (ng/mL) Amphetamine      1000 Barbiturate      200 Benzodiazepine   546 Tricyclics  300 Opiates          300 Cocaine          300 THC              50   Urinalysis, Routine w reflex microscopic     Status: Abnormal   Collection Time: 02/10/17  5:12 AM  Result Value Ref Range   Color, Urine YELLOW YELLOW   APPearance CLEAR CLEAR   Specific Gravity, Urine 1.016 1.005 - 1.030   pH 5.0 5.0 - 8.0   Glucose, UA NEGATIVE NEGATIVE mg/dL   Hgb urine dipstick NEGATIVE NEGATIVE   Bilirubin Urine NEGATIVE NEGATIVE   Ketones, ur NEGATIVE NEGATIVE mg/dL   Protein, ur NEGATIVE NEGATIVE mg/dL   Nitrite NEGATIVE NEGATIVE    Leukocytes, UA TRACE (A) NEGATIVE   RBC / HPF NONE SEEN 0 - 5 RBC/hpf   WBC, UA 0-5 0 - 5 WBC/hpf   Bacteria, UA NONE SEEN NONE SEEN   Squamous Epithelial / LPF 0-5 (A) NONE SEEN   Mucus PRESENT   Glucose, capillary     Status: Abnormal   Collection Time: 02/10/17  7:01 AM  Result Value Ref Range   Glucose-Capillary 111 (H) 65 - 99 mg/dL  I-stat troponin, ED     Status: None   Collection Time: 02/10/17  7:14 AM  Result Value Ref Range   Troponin i, poc 0.00 0.00 - 0.08 ng/mL   Comment 3            Comment: Due to the release kinetics of cTnI, a negative result within the first hours of the onset of symptoms does not rule out myocardial infarction with certainty. If myocardial infarction is still suspected, repeat the test at appropriate intervals.   TSH     Status: None   Collection Time: 02/10/17  1:39 PM  Result Value Ref Range   TSH 0.463 0.350 - 4.500 uIU/mL    Comment: Performed by a 3rd Generation assay with a functional sensitivity of <=0.01 uIU/mL.    Studies/Results:   HEAD CT FINDINGS: Brain: No evidence of acute infarction, hemorrhage, hydrocephalus, extra-axial collection or mass lesion/mass effect.  Minimal subcortical white matter change may reflect small vessel ischemic microangiopathy.  The posterior fossa, including the cerebellum, brainstem and fourth ventricle, is within normal limits. The third and lateral ventricles, and basal ganglia are unremarkable in appearance. The cerebral hemispheres are symmetric in appearance, with normal gray-white differentiation. No mass effect or midline shift is seen.  Vascular: No hyperdense vessel or unexpected calcification.  Skull: There is no evidence of fracture; visualized osseous structures are unremarkable in appearance.  Sinuses/Orbits: The orbits are within normal limits. The paranasal sinuses and mastoid air cells are well-aerated.  Other: No significant soft tissue abnormalities are  seen.  IMPRESSION: 1. No acute intracranial pathology seen on CT. 2. Minimal subcortical white matter change may reflect small vessel ischemic microangiopathy.        Micael Barb A. Merlene Laughter, M.D.  Diplomate, Tax adviser of Psychiatry and Neurology ( Neurology). 02/10/2017, 6:49 PM
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
## 2017-02-11 ENCOUNTER — Observation Stay (HOSPITAL_COMMUNITY): Payer: Non-veteran care

## 2017-02-11 ENCOUNTER — Observation Stay (HOSPITAL_COMMUNITY)
Admit: 2017-02-11 | Discharge: 2017-02-11 | Disposition: A | Discharge status: Home / Self Care | Payer: Non-veteran care | Attending: Neurology | Admitting: Neurology

## 2017-02-11 DIAGNOSIS — G259 Extrapyramidal and movement disorder, unspecified: Secondary | ICD-10-CM | POA: Diagnosis not present

## 2017-02-11 DIAGNOSIS — R202 Paresthesia of skin: Secondary | ICD-10-CM | POA: Diagnosis not present

## 2017-02-11 DIAGNOSIS — F1410 Cocaine abuse, uncomplicated: Secondary | ICD-10-CM

## 2017-02-11 DIAGNOSIS — E860 Dehydration: Secondary | ICD-10-CM | POA: Diagnosis not present

## 2017-02-11 DIAGNOSIS — R209 Unspecified disturbances of skin sensation: Secondary | ICD-10-CM

## 2017-02-11 DIAGNOSIS — R259 Unspecified abnormal involuntary movements: Secondary | ICD-10-CM

## 2017-02-11 LAB — FOLATE: FOLATE: 20.9 ng/mL (ref >5.9)

## 2017-02-11 LAB — CBC
HCT: 40.6 % (ref 39.0–52.0)
Hemoglobin: 13.4 g/dL (ref 13.0–17.0)
MCH: 30.5 pg (ref 26.0–34.0)
MCHC: 33 g/dL (ref 30.0–36.0)
MCV: 92.5 fL (ref 78.0–100.0)
PLATELETS: 288 10*3/uL (ref 150–400)
RBC: 4.39 MIL/uL (ref 4.22–5.81)
RDW: 13.1 % (ref 11.5–15.5)
WBC: 8.7 10*3/uL (ref 4.0–10.5)

## 2017-02-11 LAB — VITAMIN B12: Vitamin B-12: 386 pg/mL (ref 180–914)

## 2017-02-11 LAB — BASIC METABOLIC PANEL
Anion gap: 7 (ref 5–15)
BUN: 8 mg/dL (ref 6–20)
CHLORIDE: 106 mmol/L (ref 101–111)
CO2: 25 mmol/L (ref 22–32)
CREATININE: 1.08 mg/dL (ref 0.61–1.24)
Calcium: 8.1 mg/dL — ABNORMAL LOW (ref 8.9–10.3)
GFR calc non Af Amer: >60 mL/min (ref >60)
Glucose, Bld: 112 mg/dL — ABNORMAL HIGH (ref 65–99)
POTASSIUM: 3.6 mmol/L (ref 3.5–5.1)
SODIUM: 138 mmol/L (ref 135–145)

## 2017-02-11 LAB — CK: Total CK: 140 U/L (ref 49–397)

## 2017-02-11 LAB — MAGNESIUM: MAGNESIUM: 1.9 mg/dL (ref 1.7–2.4)

## 2017-02-11 LAB — HIV ANTIBODY (ROUTINE TESTING W REFLEX): HIV Screen 4th Generation wRfx: NONREACTIVE

## 2017-02-11 MED ORDER — LORAZEPAM 2 MG/ML IJ SOLN
2.0000 mg | Freq: Once | INTRAMUSCULAR | Status: AC
  Administered 2017-02-11: 2 mg via INTRAVENOUS
  Filled 2017-02-11: qty 1

## 2017-02-11 NOTE — Procedures (Signed)
  [facility] Alexzandra Bilton A. Gerilyn Pilgrim, MD     [email or web address]           HISTORY: THIS IS A 56-YEAR-OLD MAN WHO PRESENTS WITH EPISODES OF SHAKING INVOLVING THE RIGHT AND LEFT SIDE.  THE STUDY IS BEING DONE TO EVALUATE FOR POSSIBLE SEIZURES.  MEDICATIONS: Scheduled Meds: . aspirin EC  325 mg Oral Daily  . enoxaparin (LOVENOX) injection  50 mg Subcutaneous Q24H  . loratadine  10 mg Oral Daily  . multivitamin with minerals  1 tablet Oral Daily   Continuous Infusions: PRN Meds:.acetaminophen **OR** acetaminophen, cyclobenzaprine, DULoxetine, ondansetron **OR** ondansetron (ZOFRAN) IV, senna-docusate  Prior to Admission medications   Medication Sig Start Date End Date Taking? Authorizing Provider  aspirin EC 325 MG tablet Take 325 mg by mouth daily.   Yes [provider]  cetirizine (ZYRTEC) 10 MG tablet Take 10 mg by mouth daily.   Yes [provider]  cyclobenzaprine (FLEXERIL) 10 MG tablet Take 10 mg by mouth 3 (three) times daily as needed for muscle spasms.   Yes [provider]  diclofenac (VOLTAREN) 75 MG EC tablet Take 75 mg by mouth 2 (two) times daily.    Yes [provider]  DULoxetine (CYMBALTA) 30 MG capsule Take 30 mg by mouth daily as needed (PTSD).    Yes [provider]  Multiple Vitamins-Minerals (MULTIVITAMINS THER. W/MINERALS) TABS Take 1 tablet by mouth daily.     Yes [provider]  aspirin EC 325 MG tablet Take 1 tablet (325 mg total) by mouth daily. 02/10/17   Zadie Rhine, MD  atorvastatin (LIPITOR) 10 MG tablet Take 1 tablet (10 mg total) by mouth daily. 02/10/17   Zadie Rhine, MD      ANALYSIS: A 16 channel recording using standard 10 20 measurements is conducted for 23 minutes.   There is a well-formed posterior dominant rhythm of 10 hertz with attenuation to eye opening.  There is beta activity observed in the frontal areas.  Awake and the sleep architecture are observed.  There is brief  episodes of K complexes and sleep spindles.  Photic stimulation is carried out without abnormal changes in the background activity.  There is no focal or lateral slowing.  There is no epileptiform activity is observed.   IMPRESSION:    This is a normal recording of awake and sleep states.      Allister Lessley A. Gerilyn Pilgrim, M.D.  Diplomate, Biomedical engineer of Psychiatry and Neurology ( Neurology).
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
## 2017-02-11 NOTE — Progress Notes (Signed)
EEG Completed; Results Pending  

## 2017-02-12 DIAGNOSIS — G259 Extrapyramidal and movement disorder, unspecified: Secondary | ICD-10-CM | POA: Diagnosis not present

## 2017-02-12 DIAGNOSIS — R202 Paresthesia of skin: Secondary | ICD-10-CM | POA: Diagnosis not present

## 2017-02-12 DIAGNOSIS — R209 Unspecified disturbances of skin sensation: Secondary | ICD-10-CM | POA: Diagnosis not present

## 2017-02-12 DIAGNOSIS — F1410 Cocaine abuse, uncomplicated: Secondary | ICD-10-CM | POA: Diagnosis not present

## 2017-02-12 LAB — HOMOCYSTEINE: HOMOCYSTEINE-NORM: 9.7 umol/L (ref 0.0–15.0)

## 2017-02-12 LAB — RPR: RPR Ser Ql: NONREACTIVE

## 2017-02-12 MED ORDER — ENOXAPARIN SODIUM 60 MG/0.6ML ~~LOC~~ SOLN
55.0000 mg | SUBCUTANEOUS | Status: DC
  Filled 2017-02-12: qty 0.6

## 2017-02-12 MED ORDER — ASPIRIN EC 81 MG PO TBEC: 81.0000 mg | DELAYED_RELEASE_TABLET | Freq: Every day | ORAL | 0 refills | Status: AC

## 2017-02-12 MED ORDER — ATORVASTATIN CALCIUM 10 MG PO TABS: 10.0000 mg | ORAL_TABLET | Freq: Every day | ORAL | 0 refills | Status: AC

## 2017-02-12 NOTE — Discharge Summary (Signed)
Physician Discharge Summary  Ethan Moore:[id number] DOB: [birth year] DOA: 02/10/2017  [facility]P: [facility]  Admit date: 02/10/2017 Discharge date: 02/12/2017  Admitted From: Home Disposition:  Home   Recommendations for Outpatient Follow-up:  1. Follow up with PCP in 1-2 weeks 2. Please obtain BMP/CBC in one week   Discharge Condition: Stable CODE STATUS FULL Diet recommendation:  Regular    Brief/Interim Summary: 56 year old male with a history of PTSD, depression presenting with 1 month history of upper extremity tingling left greater than right. The patient states that he has also been feeling his legs and feet "twitching"in the same period of time. There is no exacerbating or alleviating factors. Approximately 2 hours prior to admission, the patient developed left facial numbness and tingling as well as switching. As result, the patient presented to the emergency department. CT of the brain was negative. The patient was ready for discharge when he developed diaphoresis and was noted to be "twitching"by nursing staff with difficulty speaking. As result, the patient was admitted. Neurology was consulted. They did not feel there was unifying diagnosis although they recommended EEG and outpatient EMG.  Discharge Diagnoses:  Sensory disturbance -Check serum folate--20.9 -Serum B12--386 -Check RPR--neg -HIV negative -TSH is 0.463 -MR cervical spine--No cord lesions to account for upper extremity dysesthesias. No central canal stenosis. Multilevel neuroforaminal stenosis, worst at C5-6 -suspected related to cocaine use -heavy metal screen pending -check mag--1.9 -The patient had a recent admission at [facility] 4 days prior to this admission--> MRI of the brain, CT angiogram head and neck, and echo were unremarkable. -outpt neurology follow up for NCV  Abnormal movement -?tremor vs myoclonus -low suspicion of seizure -CK--140 -awaiting EEG--negative for  epileptiform discharges. -Follow up with neurology in the outpatient setting  Cocaine abuse -The patient denies using -He feels that his friends may have been putting illicit substances into his drinks  PTSD -follow up at [facility]   Discharge Instructions  Discharge Instructions    Diet - low sodium heart healthy    Complete by:  As directed    Increase activity slowly    Complete by:  As directed      Allergies as of 02/12/2017   No Known Allergies     Medication List    STOP taking these medications   benzonatate 100 MG capsule Commonly known as:  TESSALON   doxycycline 100 MG capsule Commonly known as:  VIBRAMYCIN   traMADol 50 MG tablet Commonly known as:  ULTRAM     TAKE these medications   aspirin EC 325 MG tablet Take 325 mg by mouth daily. What changed:  Another medication with the same name was added. Make sure you understand how and when to take each.   aspirin EC 325 MG tablet Take 1 tablet (325 mg total) by mouth daily. What changed:  You were already taking a medication with the same name, and this prescription was added. Make sure you understand how and when to take each.   atorvastatin 10 MG tablet Commonly known as:  LIPITOR Take 1 tablet (10 mg total) by mouth daily.   cetirizine 10 MG tablet Commonly known as:  ZYRTEC Take 10 mg by mouth daily.   cyclobenzaprine 10 MG tablet Commonly known as:  FLEXERIL Take 10 mg by mouth 3 (three) times daily as needed for muscle spasms.   diclofenac 75 MG EC tablet Commonly known as:  VOLTAREN Take 75 mg by mouth 2 (two) times daily.   DULoxetine  30 MG capsule Commonly known as:  CYMBALTA Take 30 mg by mouth daily as needed (PTSD).   multivitamins ther. w/minerals Tabs tablet Take 1 tablet by mouth daily.      Follow-up Information    [facility] In 1 week.   Why:  for blood pressure check Contact information: [address] [facility] [place]  [address] [phone]          No Known Allergies  Consultations:  neurology   Procedures/Studies: Ct Head Wo Contrast  Result Date: 02/10/2017 CLINICAL DATA:  Acute onset of left facial tingling and right arm numbness. Initial encounter. EXAM: CT HEAD WITHOUT CONTRAST TECHNIQUE: Contiguous axial images were obtained from the base of the skull through the vertex without intravenous contrast. COMPARISON:  CT of the head performed 03/08/2013 FINDINGS: Brain: No evidence of acute infarction, hemorrhage, hydrocephalus, extra-axial collection or mass lesion/mass effect. Minimal subcortical white matter change may reflect small vessel ischemic microangiopathy. The posterior fossa, including the cerebellum, brainstem and fourth ventricle, is within normal limits. The third and lateral ventricles, and basal ganglia are unremarkable in appearance. The cerebral hemispheres are symmetric in appearance, with normal gray-white differentiation. No mass effect or midline shift is seen. Vascular: No hyperdense vessel or unexpected calcification. Skull: There is no evidence of fracture; visualized osseous structures are unremarkable in appearance. Sinuses/Orbits: The orbits are within normal limits. The paranasal sinuses and mastoid air cells are well-aerated. Other: No significant soft tissue abnormalities are seen. IMPRESSION: 1. No acute intracranial pathology seen on CT. 2. Minimal subcortical white matter change may reflect small vessel ischemic microangiopathy. Electronically Signed   By: Roanna Raider M.D.   On: 02/10/2017 05:11   Mr Cervical Spine Wo Contrast  Result Date: 02/11/2017 CLINICAL DATA:  Bilateral upper extremity numbness and weakness. EXAM: MRI CERVICAL SPINE WITHOUT CONTRAST TECHNIQUE: Multiplanar, multisequence MR imaging of the cervical spine was performed. No intravenous contrast was administered. COMPARISON:  None. FINDINGS: Alignment: Normal Vertebrae: No acute compression fracture,  discitis-osteomyelitis, facet edema or other focal marrow lesion. No epidural collection. Cord: Normal caliber and signal. Posterior Fossa, vertebral arteries, paraspinal tissues: Negative. Disc levels: C1-C2: Mild degenerative change. C2-C3: Normal disc space and facets. No spinal canal or neuroforaminal stenosis. C3-C4: Right eccentric disc osteophyte complex mild right foraminal/extraforaminal narrowing. No spinal canal stenosis. C4-C5: Moderate left facet hypertrophy with resultant mild left neural foraminal stenosis. C5-C6: Right-sided uncovertebral hypertrophy with moderate-to-severe right neural foraminal stenosis. No spinal canal stenosis. C6-C7: Normal disc space and facets. No spinal canal or neuroforaminal stenosis. C7-T1: Normal disc space and facets. No spinal canal or neuroforaminal stenosis. IMPRESSION: 1. Multilevel neural foraminal stenosis, worst at right C5-C6 (moderate-to-severe). No spinal canal stenosis. 2. No spinal cord lesion or specific explanation for the reported bilateral upper extremity numbness and weakness. Electronically Signed   By: Deatra Robinson M.D.   On: 02/11/2017 17:06   Dg Chest Port 1 View  Result Date: 02/10/2017 CLINICAL DATA:  Dizziness.  No chest complaints.  Smoker. EXAM: PORTABLE CHEST 1 VIEW COMPARISON:  11/16/2016 FINDINGS: Midline trachea. Normal heart size and mediastinal contours. No pleural effusion or pneumothorax. Clear lungs. Numerous leads and wires project over the chest. IMPRESSION: No active disease. Electronically Signed   By: Jeronimo Greaves M.D.   On: 02/10/2017 07:31        Discharge Exam: Vitals:   02/11/17 2127 02/12/17 0617  BP: 119/70 99/62  Pulse: 79 69  Resp: 20 18  Temp: 98.5 F (36.9 C) 98.3  F (36.8 C)  SpO2: 99% 100%   Vitals:   02/11/17 1601 02/11/17 2116 02/11/17 2127 02/12/17 0617  BP: 123/83  119/70 99/62  Pulse: 64  79 69  Resp: Temp: 98.2 F (36.8 C)  98.5 F (36.9 C) 98.3 F (36.8 C)  TempSrc: Oral   Oral Oral  SpO2: 100% 97% 99% 100%  Weight:      Height:        General: Pt is alert, awake, not in acute distress Cardiovascular: RRR, S1/S2 +, no rubs, no gallops Respiratory: CTA bilaterally, no wheezing, no rhonchi Abdominal: Soft, NT, ND, bowel sounds + Extremities: no edema, no cyanosis   The results of significant diagnostics from this hospitalization (including imaging, microbiology, ancillary and laboratory) are listed below for reference.    Significant Diagnostic Studies: Ct Head Wo Contrast  Result Date: 02/10/2017 CLINICAL DATA:  Acute onset of left facial tingling and right arm numbness. Initial encounter. EXAM: CT HEAD WITHOUT CONTRAST TECHNIQUE: Contiguous axial images were obtained from the base of the skull through the vertex without intravenous contrast. COMPARISON:  CT of the head performed 03/08/2013 FINDINGS: Brain: No evidence of acute infarction, hemorrhage, hydrocephalus, extra-axial collection or mass lesion/mass effect. Minimal subcortical white matter change may reflect small vessel ischemic microangiopathy. The posterior fossa, including the cerebellum, brainstem and fourth ventricle, is within normal limits. The third and lateral ventricles, and basal ganglia are unremarkable in appearance. The cerebral hemispheres are symmetric in appearance, with normal gray-white differentiation. No mass effect or midline shift is seen. Vascular: No hyperdense vessel or unexpected calcification. Skull: There is no evidence of fracture; visualized osseous structures are unremarkable in appearance. Sinuses/Orbits: The orbits are within normal limits. The paranasal sinuses and mastoid air cells are well-aerated. Other: No significant soft tissue abnormalities are seen. IMPRESSION: 1. No acute intracranial pathology seen on CT. 2. Minimal subcortical white matter change may reflect small vessel ischemic microangiopathy. Electronically Signed   By: Roanna Raider M.D.   On: 02/10/2017  05:11   Mr Cervical Spine Wo Contrast  Result Date: 02/11/2017 CLINICAL DATA:  Bilateral upper extremity numbness and weakness. EXAM: MRI CERVICAL SPINE WITHOUT CONTRAST TECHNIQUE: Multiplanar, multisequence MR imaging of the cervical spine was performed. No intravenous contrast was administered. COMPARISON:  None. FINDINGS: Alignment: Normal Vertebrae: No acute compression fracture, discitis-osteomyelitis, facet edema or other focal marrow lesion. No epidural collection. Cord: Normal caliber and signal. Posterior Fossa, vertebral arteries, paraspinal tissues: Negative. Disc levels: C1-C2: Mild degenerative change. C2-C3: Normal disc space and facets. No spinal canal or neuroforaminal stenosis. C3-C4: Right eccentric disc osteophyte complex mild right foraminal/extraforaminal narrowing. No spinal canal stenosis. C4-C5: Moderate left facet hypertrophy with resultant mild left neural foraminal stenosis. C5-C6: Right-sided uncovertebral hypertrophy with moderate-to-severe right neural foraminal stenosis. No spinal canal stenosis. C6-C7: Normal disc space and facets. No spinal canal or neuroforaminal stenosis. C7-T1: Normal disc space and facets. No spinal canal or neuroforaminal stenosis. IMPRESSION: 1. Multilevel neural foraminal stenosis, worst at right C5-C6 (moderate-to-severe). No spinal canal stenosis. 2. No spinal cord lesion or specific explanation for the reported bilateral upper extremity numbness and weakness. Electronically Signed   By: Deatra Robinson M.D.   On: 02/11/2017 17:06   Dg Chest Port 1 View  Result Date: 02/10/2017 CLINICAL DATA:  Dizziness.  No chest complaints.  Smoker. EXAM: PORTABLE CHEST 1 VIEW COMPARISON:  11/16/2016 FINDINGS: Midline trachea. Normal heart size and mediastinal contours. No pleural effusion or pneumothorax. Clear lungs. Numerous  leads and wires project over the chest. IMPRESSION: No active disease. Electronically Signed   By: Jeronimo Greaves M.D.   On: 02/10/2017 07:31       Microbiology: No results found for this or any previous visit (from the past 240 hour(s)).   Labs: Basic Metabolic Panel:  Recent Labs Lab 02/10/17 0425 02/11/17 0416 02/11/17 1419  NA 134* 138  --   K 3.3* 3.6  --   CL 101 106  --   CO2 26 25  --   GLUCOSE 116* 112*  --   BUN 12 8  --   CREATININE 1.32* 1.08  --   CALCIUM 8.8* 8.1*  --   MG  --   --  1.9   Liver Function Tests:  Recent Labs Lab 02/10/17 0425  AST 21  ALT 20  ALKPHOS 95  BILITOT 1.0  PROT 7.8  ALBUMIN 4.2   No results for input(s): LIPASE, AMYLASE in the last 168 hours. No results for input(s): AMMONIA in the last 168 hours. CBC:  Recent Labs Lab 02/10/17 0425 02/11/17 0416  WBC 10.4 8.7  NEUTROABS 8.3*  --   HGB 14.4 13.4  HCT 42.6 40.6  MCV 91.6 92.5  PLT 336 288   Cardiac Enzymes:  Recent Labs Lab 02/11/17 1419  CKTOTAL 140   BNP: Invalid input(s): POCBNP CBG:  Recent Labs Lab 02/10/17 0701  GLUCAP 111*    Time coordinating discharge:  Greater than 30 minutes  Signed:  Istvan Behar, DO [facility] Pager: [phone] 02/12/2017, 1:16 PM

## 2017-02-12 NOTE — Progress Notes (Signed)
Pt is being d/c'd home via w/c to private automobile with his daughter at the bedside.  All d/c instructions were reviewed with the patient.  Prescriptions for Lipitor and Aspirin were given to the patient with instructions to get them filled at any pharmacy.  [facility].  Educated patient on signs/symptoms of Paresthesia and when to contact a doctor of visit any area ED if it occurs.  Pt verbalizes understanding and is d/c'd in stable condition.

## 2017-02-17 LAB — HEAVY METALS PROFILE, URINE
Arsenic (Total),U: NOT DETECTED ug/L (ref 0–50)
Arsenic(Inorganic),U: NOT DETECTED ug/L (ref 0–19)
Creatinine(Crt),U: 2.04 g/L (ref 0.30–3.00)
LEAD RANDOM URINE: 2 ug/L (ref 0–49)
LEAD/CREAT. RATIO: 1 ug/g{creat} (ref 0–49)
MERCURY UR: NOT DETECTED ug/L (ref 0–19)

## 2019-06-22 ENCOUNTER — Other Ambulatory Visit: Payer: Self-pay

## 2019-06-22 ENCOUNTER — Observation Stay (HOSPITAL_COMMUNITY)
Admission: EM | Admit: 2019-06-22 | Discharge: 2019-06-25 | Disposition: A | Discharge status: Home / Self Care | Payer: No Typology Code available for payment source | Attending: Internal Medicine | Admitting: Internal Medicine

## 2019-06-22 ENCOUNTER — Observation Stay (HOSPITAL_COMMUNITY): Payer: No Typology Code available for payment source

## 2019-06-22 ENCOUNTER — Encounter (HOSPITAL_COMMUNITY): Payer: Self-pay | Admitting: Emergency Medicine

## 2019-06-22 ENCOUNTER — Emergency Department (HOSPITAL_COMMUNITY): Payer: No Typology Code available for payment source

## 2019-06-22 DIAGNOSIS — F17210 Nicotine dependence, cigarettes, uncomplicated: Secondary | ICD-10-CM | POA: Diagnosis not present

## 2019-06-22 DIAGNOSIS — R45851 Suicidal ideations: Secondary | ICD-10-CM | POA: Insufficient documentation

## 2019-06-22 DIAGNOSIS — J329 Chronic sinusitis, unspecified: Secondary | ICD-10-CM | POA: Insufficient documentation

## 2019-06-22 DIAGNOSIS — Z79899 Other long term (current) drug therapy: Secondary | ICD-10-CM | POA: Diagnosis not present

## 2019-06-22 DIAGNOSIS — U071 COVID-19: Secondary | ICD-10-CM | POA: Diagnosis not present

## 2019-06-22 DIAGNOSIS — E876 Hypokalemia: Secondary | ICD-10-CM | POA: Diagnosis present

## 2019-06-22 DIAGNOSIS — Z825 Family history of asthma and other chronic lower respiratory diseases: Secondary | ICD-10-CM | POA: Insufficient documentation

## 2019-06-22 DIAGNOSIS — R42 Dizziness and giddiness: Secondary | ICD-10-CM | POA: Insufficient documentation

## 2019-06-22 DIAGNOSIS — F1490 Cocaine use, unspecified, uncomplicated: Secondary | ICD-10-CM

## 2019-06-22 DIAGNOSIS — F1410 Cocaine abuse, uncomplicated: Secondary | ICD-10-CM | POA: Diagnosis not present

## 2019-06-22 DIAGNOSIS — W19XXXA Unspecified fall, initial encounter: Secondary | ICD-10-CM | POA: Diagnosis not present

## 2019-06-22 DIAGNOSIS — E860 Dehydration: Secondary | ICD-10-CM | POA: Insufficient documentation

## 2019-06-22 DIAGNOSIS — Z9114 Patient's other noncompliance with medication regimen: Secondary | ICD-10-CM | POA: Insufficient documentation

## 2019-06-22 DIAGNOSIS — D72829 Elevated white blood cell count, unspecified: Secondary | ICD-10-CM | POA: Diagnosis not present

## 2019-06-22 DIAGNOSIS — S0990XA Unspecified injury of head, initial encounter: Secondary | ICD-10-CM | POA: Insufficient documentation

## 2019-06-22 DIAGNOSIS — F4310 Post-traumatic stress disorder, unspecified: Secondary | ICD-10-CM | POA: Diagnosis not present

## 2019-06-22 DIAGNOSIS — Z7901 Long term (current) use of anticoagulants: Secondary | ICD-10-CM | POA: Diagnosis not present

## 2019-06-22 DIAGNOSIS — G8929 Other chronic pain: Secondary | ICD-10-CM | POA: Insufficient documentation

## 2019-06-22 DIAGNOSIS — R9431 Abnormal electrocardiogram [ECG] [EKG]: Secondary | ICD-10-CM | POA: Diagnosis present

## 2019-06-22 DIAGNOSIS — M549 Dorsalgia, unspecified: Secondary | ICD-10-CM | POA: Diagnosis not present

## 2019-06-22 DIAGNOSIS — E785 Hyperlipidemia, unspecified: Secondary | ICD-10-CM | POA: Insufficient documentation

## 2019-06-22 DIAGNOSIS — F329 Major depressive disorder, single episode, unspecified: Secondary | ICD-10-CM | POA: Diagnosis not present

## 2019-06-22 DIAGNOSIS — Z7982 Long term (current) use of aspirin: Secondary | ICD-10-CM | POA: Diagnosis not present

## 2019-06-22 DIAGNOSIS — R7989 Other specified abnormal findings of blood chemistry: Secondary | ICD-10-CM | POA: Diagnosis present

## 2019-06-22 DIAGNOSIS — Z791 Long term (current) use of non-steroidal anti-inflammatories (NSAID): Secondary | ICD-10-CM | POA: Insufficient documentation

## 2019-06-22 DIAGNOSIS — R945 Abnormal results of liver function studies: Secondary | ICD-10-CM | POA: Diagnosis present

## 2019-06-22 LAB — CBC
HCT: 47.5 % (ref 39.0–52.0)
Hemoglobin: 15.6 g/dL (ref 13.0–17.0)
MCH: 30.5 pg (ref 26.0–34.0)
MCHC: 32.8 g/dL (ref 30.0–36.0)
MCV: 93 fL (ref 80.0–100.0)
Platelets: 474 10*3/uL — ABNORMAL HIGH (ref 150–400)
RBC: 5.11 MIL/uL (ref 4.22–5.81)
RDW: 16.3 % — ABNORMAL HIGH (ref 11.5–15.5)
WBC: 15.7 10*3/uL — ABNORMAL HIGH (ref 4.0–10.5)
nRBC: 12 % — ABNORMAL HIGH (ref 0.0–0.2)

## 2019-06-22 LAB — RAPID URINE DRUG SCREEN, HOSP PERFORMED
Amphetamines: NOT DETECTED
Barbiturates: NOT DETECTED
Benzodiazepines: NOT DETECTED
Cocaine: POSITIVE — AB
Opiates: NOT DETECTED
Tetrahydrocannabinol: NOT DETECTED

## 2019-06-22 LAB — COMPREHENSIVE METABOLIC PANEL
ALT: 74 U/L — ABNORMAL HIGH (ref 0–44)
AST: 43 U/L — ABNORMAL HIGH (ref 15–41)
Albumin: 4.4 g/dL (ref 3.5–5.0)
Alkaline Phosphatase: 109 U/L (ref 38–126)
Anion gap: 14 (ref 5–15)
BUN: 14 mg/dL (ref 6–20)
CO2: 24 mmol/L (ref 22–32)
Calcium: 8.8 mg/dL — ABNORMAL LOW (ref 8.9–10.3)
Chloride: 98 mmol/L (ref 98–111)
Creatinine, Ser: 1.22 mg/dL (ref 0.61–1.24)
GFR calc Af Amer: >60 mL/min (ref >60)
GFR calc non Af Amer: >60 mL/min (ref >60)
Glucose, Bld: 111 mg/dL — ABNORMAL HIGH (ref 70–99)
Potassium: 2.3 mmol/L — CL (ref 3.5–5.1)
Sodium: 136 mmol/L (ref 135–145)
Total Bilirubin: 1.8 mg/dL — ABNORMAL HIGH (ref 0.3–1.2)
Total Protein: 8.4 g/dL — ABNORMAL HIGH (ref 6.5–8.1)

## 2019-06-22 LAB — SALICYLATE LEVEL: Salicylate Lvl: <7 mg/dL — ABNORMAL LOW (ref 7.0–30.0)

## 2019-06-22 LAB — RESPIRATORY PANEL BY RT PCR (FLU A&B, COVID)
Influenza A by PCR: NEGATIVE
Influenza B by PCR: NEGATIVE
SARS Coronavirus 2 by RT PCR: POSITIVE — AB

## 2019-06-22 LAB — ACETAMINOPHEN LEVEL: Acetaminophen (Tylenol), Serum: <10 ug/mL — ABNORMAL LOW (ref 10–30)

## 2019-06-22 LAB — ETHANOL: Alcohol, Ethyl (B): <10 mg/dL (ref <10)

## 2019-06-22 LAB — MAGNESIUM: Magnesium: 2.5 mg/dL — ABNORMAL HIGH (ref 1.7–2.4)

## 2019-06-22 MED ORDER — POTASSIUM CHLORIDE CRYS ER 20 MEQ PO TBCR
40.0000 meq | EXTENDED_RELEASE_TABLET | Freq: Once | ORAL | Status: AC
  Administered 2019-06-22: 40 meq via ORAL
  Filled 2019-06-22: qty 2

## 2019-06-22 MED ORDER — POTASSIUM CHLORIDE 10 MEQ/100ML IV SOLN
10.0000 meq | INTRAVENOUS | Status: AC
  Administered 2019-06-22 (×3): 10 meq via INTRAVENOUS
  Filled 2019-06-22 (×3): qty 100

## 2019-06-22 MED ORDER — DICLOFENAC SODIUM 75 MG PO TBEC
75.0000 mg | DELAYED_RELEASE_TABLET | Freq: Two times a day (BID) | ORAL | Status: DC
  Filled 2019-06-22 (×4): qty 1

## 2019-06-22 MED ORDER — ENOXAPARIN SODIUM 40 MG/0.4ML ~~LOC~~ SOLN
40.0000 mg | SUBCUTANEOUS | Status: DC
  Administered 2019-06-22 – 2019-06-24 (×3): 40 mg via SUBCUTANEOUS
  Filled 2019-06-22 (×3): qty 0.4

## 2019-06-22 MED ORDER — CYCLOBENZAPRINE HCL 10 MG PO TABS: 10.0000 mg | ORAL_TABLET | Freq: Three times a day (TID) | ORAL | Status: DC | PRN

## 2019-06-22 MED ORDER — DULOXETINE HCL 30 MG PO CPEP: 30.0000 mg | ORAL_CAPSULE | Freq: Every day | ORAL | Status: DC | PRN

## 2019-06-22 MED ORDER — ACETAMINOPHEN 325 MG PO TABS: 650.0000 mg | ORAL_TABLET | ORAL | Status: DC | PRN

## 2019-06-22 MED ORDER — ATORVASTATIN CALCIUM 10 MG PO TABS
10.0000 mg | ORAL_TABLET | Freq: Every day | ORAL | Status: DC
  Administered 2019-06-23 – 2019-06-24 (×2): 10 mg via ORAL
  Filled 2019-06-22 (×2): qty 1

## 2019-06-22 MED ORDER — POTASSIUM CHLORIDE IN NACL 40-0.9 MEQ/L-% IV SOLN
INTRAVENOUS | Status: AC
  Administered 2019-06-22: 125 mL/h via INTRAVENOUS
  Filled 2019-06-22 (×2): qty 1000

## 2019-06-22 MED ORDER — ASPIRIN EC 325 MG PO TBEC
325.0000 mg | DELAYED_RELEASE_TABLET | Freq: Every day | ORAL | Status: DC
  Administered 2019-06-23 – 2019-06-25 (×3): 325 mg via ORAL
  Filled 2019-06-22 (×3): qty 1

## 2019-06-22 MED ORDER — LORATADINE 10 MG PO TABS
10.0000 mg | ORAL_TABLET | Freq: Every day | ORAL | Status: DC
  Administered 2019-06-23 – 2019-06-25 (×3): 10 mg via ORAL
  Filled 2019-06-22 (×3): qty 1

## 2019-06-22 NOTE — ED Notes (Addendum)
Pt reports that he recently gotten out of jail and has not had his antidepressant . Pt reports he has been having thoughts of suicide. Pt reports that he had been laying in his [place] floor for days before able to call police to help him get to ED. Reports he hit his head falling in [place]

## 2019-06-22 NOTE — BH Assessment (Addendum)
Tele Assessment Note   Patient Name: Ethan Moore MRN: [id number] Referring Physician: Sherwood Gambler, MD Location of Patient: [facility] Location of Provider: [place] E Crutcher is a widowed 59 y.o. male who presents voluntarily to [place] with request for help in getting to [place]. Pt is reporting symptoms of depression with suicidal ideation. Pt has a history of PTSD, 2 past suicide attempts and substance abuse (cocaine & alcohol).  Pt reports current suicidal ideation with plans of using knives. Pt acknowledges multiple symptoms of Depression, including anhedonia, isolating, feelings of guilt,  changes in sleep & appetite, & increased irritability. Pt reports homicidal ideation "a little bit toward people who piss me off". Pt reports past history of violence. He denies auditory & visual hallucinations & reports paranoid feelings of people being out to get him. Pt states current stressors include having a Covid exposure after leaving jail- causing pt to not want to return home to live with his elderly mother.   Pt is homeless right now. He will eventually be able to return to mother's home. Supports include his mother. Pt denies hx of abuse. 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 Pt denies family history of mental illness, suicide and substance abuse. Pt has fair insight and judgment. Pt's memory is intact. Legal history includes supervised probation. Pt was in prison for auto theft. He states he was slow to return someone's car.  Protective factors against suicide include good family support, therapeutic relationship, no access to firearms, & no current psychoticsymptoms. Pt's [facility] & [facility]. Pt reports last cocaine and alcohol use was 60 days ago. Pt's current UDS positive for cocaine. ? MSE: Pt is casually dressed, alert, oriented x4 with rapid speech and normal motor behavior. Eye contact is good. Pt's mood is pleasant & preoccupied and  affect is preoccupied. Affect is congruent with mood. Thought process is relevant and with flight of ideas. There is no indication pt is currently responding to internal stimuli or experiencing delusional thought content. Pt was cooperative throughout assessment.     Diagnosis: PTSD; cocaine abuse; alcohol abuse Disposition: Mordecai Maes, NP recommends inpt psychiatric tx. Pt has [facility] benefits   Past Medical History:  Past Medical History:  Diagnosis Date  . Back injury   . Chronic back pain   . Chronic neck pain   . Depression   . Dizziness   . Neck injury   . PTSD (post-traumatic stress disorder)     History reviewed. No pertinent surgical history.  Family History: No family history on file.  Social History:  reports that he has been smoking cigarettes. He has never used smokeless tobacco. He reports current alcohol use. He reports that he does not use drugs.  Additional Social History:  Alcohol / Drug Use Pain Medications: See MAR Prescriptions: See MAR Over the Counter: See MAR History of alcohol / drug use?: No history of alcohol / drug abuse Substance #1 Name of Substance 1: alcohol 1 - Last Use / Amount: 60 days ago (have been in jail for 55 days) Substance #2 Name of Substance 2: cocaine 2 - Last Use / Amount: 60 days ago (have been in jail)  CIWA: CIWA-Ar BP: 124/80 Pulse Rate: 84 COWS:    Allergies: No Known Allergies  Home Medications: (Not in a hospital admission)   OB/GYN Status:  No LMP for male patient.  General Assessment Data TTS Assessment: In system Is this a Tele or Face-to-Face  Assessment?: Tele Assessment Is this an Initial Assessment or a Re-assessment for this encounter?: Initial Assessment Patient Accompanied by:: N/A Language Other than English: No Living Arrangements: Homeless/Shelter(doesn't want to return to living with mother due Covid expos) What gender do you identify as?: Male Marital status: Widowed Living Arrangements:  Alone Can pt return to current living arrangement?: No Admission Status: Voluntary Is patient capable of signing voluntary admission?: Yes Referral Source: Self/Family/Friend Insurance type: [facility]     Crisis Care Plan Living Arrangements: Alone Name of Psychiatrist: at [place] in [place] Name of Therapist: at [facility]  Education Status Is patient currently in school?: No  Risk to self with the past 6 months Suicidal Ideation: Yes-Currently Present Has patient been a risk to self within the past 6 months prior to admission? : Yes Suicidal Intent: No-Not Currently/Within Last 6 Months Has patient had any suicidal intent within the past 6 months prior to admission? : Yes Is patient at risk for suicide?: Yes Suicidal Plan?: No-Not Currently/Within Last 6 Months Has patient had any suicidal plan within the past 6 months prior to admission? : Yes What has been your use of drugs/alcohol within the last 12 months?: none due to in prison past 55 days Previous Attempts/Gestures: Yes How many times?: 2 Other Self Harm Risks: widower, older, male, current SI, past attempts Triggers for Past Attempts: Other (Comment)(after wife's death) Intentional Self Injurious Behavior: None Family Suicide History: No Recent stressful life event(s): Recent negative physical changes, Loss (Comment) Persecutory voices/beliefs?: No Depression: Yes Depression Symptoms: Despondent, Insomnia, Isolating, Fatigue, Guilt, Loss of interest in usual pleasures, Feeling worthless/self pity, Feeling angry/irritable Substance abuse history and/or treatment for substance abuse?: No Suicide prevention information given to non-admitted patients: Not applicable  Risk to Others within the past 6 months Homicidal Ideation: Yes-Currently Present("little bit toward people who piss me off") Does patient have any lifetime risk of violence toward others beyond the six months prior to admission? : Yes (comment) Thoughts of Harm to Others:  No Current Homicidal Intent: No Current Homicidal Plan: No Access to Homicidal Means: No History of harm to others?: No Assessment of Violence: In distant past Does patient have access to weapons?: No Criminal Charges Pending?: No Does patient have a court date: No Is patient on probation?: Yes(supervised probation)  Psychosis Hallucinations: None noted Delusions: Persecutory  Mental Status Report Appearance/Hygiene: Unremarkable Eye Contact: Good Motor Activity: Unremarkable Speech: Rapid Level of Consciousness: Alert Mood: Preoccupied, Pleasant Affect: Preoccupied Anxiety Level: Minimal Thought Processes: Flight of Ideas, Relevant Judgement: Partial Orientation: Appropriate for developmental age Obsessive Compulsive Thoughts/Behaviors: None  Cognitive Functioning Concentration: Good Memory: Recent Intact, Remote Intact Is patient IDD: No Insight: Fair Impulse Control: Fair Appetite: Poor(nothing to eat since out of jail) Have you had any weight changes? : No Change Sleep: Decreased Total Hours of Sleep: 2 Vegetative Symptoms: None  ADLScreening [facility] Assessment Services) Patient's cognitive ability adequate to safely complete daily activities?: Yes Patient able to express need for assistance with ADLs?: Yes Independently performs ADLs?: Yes (appropriate for developmental age)  Prior Inpatient Therapy Prior Inpatient Therapy: [facility]) Prior Therapy Dates: 01/2019 [facility] [place] Reason for Treatment: depression, SI  Prior Outpatient Therapy Prior Outpatient Therapy: Yes Prior Therapy Dates: ongoing [facility] Reason for Treatment: depression, SI Does patient have an ACCT team?: No Does patient have Intensive In-House Services?  : No Does patient have [facility] services? : No Does patient have P4CC services?: No  ADL Screening (condition at time  of admission) Patient's cognitive ability adequate to  safely complete daily activities?: Yes Is the patient deaf or have difficulty hearing?: No Does the patient have difficulty seeing, even when wearing glasses/contacts?: No Does the patient have difficulty concentrating, remembering, or making decisions?: No Patient able to express need for assistance with ADLs?: Yes Does the patient have difficulty dressing or bathing?: No Independently performs ADLs?: Yes (appropriate for developmental age) Does the patient have difficulty walking or climbing stairs?: No Weakness of Legs: None Weakness of Arms/Hands: None  Home Assistive Devices/Equipment Home Assistive Devices/Equipment: None  Therapy Consults (therapy consults require a physician order) PT Evaluation Needed: No OT Evalulation Needed: No SLP Evaluation Needed: No Abuse/Neglect Assessment (Assessment to be complete while patient is alone) Abuse/Neglect Assessment Can Be Completed: Yes Physical Abuse: Denies Verbal Abuse: Denies Sexual Abuse: Denies Exploitation of patient/patient's resources: Denies Self-Neglect: Denies Values / Beliefs Cultural Requests During Hospitalization: None Spiritual Requests During Hospitalization: None Consults Spiritual Care Consult Needed: No Transition of Care Team Consult Needed: No Advance Directives (For Healthcare) Does Patient Have a Medical Advance Directive?: No Would patient like information on creating a medical advance directive?: No - Patient declined          Disposition: Denzil Magnuson, NP recommends inpt psychiatric tx. Pt has [facility] benefits Disposition Initial Assessment Completed for this Encounter: Yes Disposition of Patient: Admit Type of inpatient treatment program: Adult([facility])  This service was provided via telemedicine using a 2-way, interactive audio and video technology.  Names of all persons participating in this telemedicine service and their role in this encounter. Name: Lorraine Terriquez Role: pt  Name:Shakti Fleer  Benedetto Goad, lcsw Role: TTS  Name: Denzil Magnuson, NP Provider       Clearnce Sorrel 06/22/2019 4:51 PM

## 2019-06-22 NOTE — ED Notes (Signed)
Spoke with Myia at crisis line for [place]. She will start a file and documentation. Once we complete ED MSE and TTS  SW to get in touch with nearest [place] hosp which is in [place]

## 2019-06-22 NOTE — ED Notes (Signed)
TTS in progress 

## 2019-06-22 NOTE — ED Provider Notes (Signed)
[facility] EMERGENCY DEPARTMENT Provider Note   CSN: [id number] Arrival date & time: 06/22/19  1424     History Chief Complaint  Patient presents with  . Suicidal    Ethan Moore is a 59 y.o. male.  HPI 59 year old male presents requesting transfer to the [facility] in [place].  He states he is suicidal.  Has been ongoing for about 4 days.  Recurrent issue for him.  Has been out of his meds for several days as well.  He does note that he fell a few days ago and hit his head.  The right forehead swelling has gone down but he still has a little bit of right lateral neck pain. Still has some headache. No weakness or numbness.  He denies being on a blood thinner or aspirin typically.  Otherwise is feeling fine besides the acute suicidality.   Past Medical History:  Diagnosis Date  . Back injury   . Chronic back pain   . Chronic neck pain   . Depression   . Dizziness   . Neck injury   . PTSD (post-traumatic stress disorder)     Patient Active Problem List   Diagnosis Date Noted  . Hypokalemia 06/22/2019  . Sensory disturbance 02/11/2017  . Abnormal movement 02/11/2017  . Dehydration   . Paresthesia   . Syncope 02/10/2017  . Cocaine abuse (HCC) 02/10/2017    History reviewed. No pertinent surgical history.     No family history on file.  Social History   Tobacco Use  . Smoking status: Current Some Day Smoker    Types: Cigarettes  . Smokeless tobacco: Never Used  Substance Use Topics  . Alcohol use: Yes  . Drug use: No    Home Medications Prior to Admission medications   Medication Sig Start Date End Date Taking? Authorizing Provider  aspirin EC 325 MG tablet Take 325 mg by mouth daily.    [provider]  atorvastatin (LIPITOR) 10 MG tablet Take 1 tablet (10 mg total) by mouth daily. 02/12/17   Catarina Hartshorn, MD  cetirizine (ZYRTEC) 10 MG tablet Take 10 mg by mouth daily.    [provider]  cyclobenzaprine (FLEXERIL) 10 MG tablet Take  10 mg by mouth 3 (three) times daily as needed for muscle spasms.    [provider]  diclofenac (VOLTAREN) 75 MG EC tablet Take 75 mg by mouth 2 (two) times daily.     [provider]  DULoxetine (CYMBALTA) 30 MG capsule Take 30 mg by mouth daily as needed (PTSD).     [provider]  Multiple Vitamins-Minerals (MULTIVITAMINS THER. W/MINERALS) TABS Take 1 tablet by mouth daily.      [provider]    Allergies    Patient has no known allergies.  Review of Systems   Review of Systems  Respiratory: Negative for cough.   Cardiovascular: Negative for chest pain.  Musculoskeletal: Negative for neck pain.  Neurological: Positive for headaches.  All other systems reviewed and are negative.   Physical Exam Updated Vital Signs BP (!) 141/78   Pulse 98   Temp 98.4 F (36.9 C) (Oral)   Resp (!) 24   Ht 6\' 4"  (1.93 m)   Wt 113.4 kg   SpO2 100%   BMI 30.43 kg/m   Physical Exam Vitals and nursing note reviewed.  Constitutional:      General: He is not in acute distress.    Appearance: He is well-developed. He is not ill-appearing  or diaphoretic.  HENT:     Head: Normocephalic and atraumatic.     Comments: No obvious signs of external head trauma    Right Ear: External ear normal.     Left Ear: External ear normal.     Nose: Nose normal.  Eyes:     General:        Right eye: No discharge.        Left eye: No discharge.     Extraocular Movements: Extraocular movements intact.  Cardiovascular:     Rate and Rhythm: Normal rate and regular rhythm.     Heart sounds: Normal heart sounds.  Pulmonary:     Effort: Pulmonary effort is normal.     Breath sounds: Normal breath sounds.  Abdominal:     Palpations: Abdomen is soft.     Tenderness: There is no abdominal tenderness.  Musculoskeletal:     Cervical back: Normal range of motion and neck supple. No rigidity. No spinous process tenderness or muscular tenderness.  Skin:    General: Skin is  warm and dry.  Neurological:     Mental Status: He is alert and oriented to person, place, and time.     Comments: CN 3-12 grossly intact. 5/5 strength in all 4 extremities. Grossly normal sensation.   Psychiatric:        Mood and Affect: Mood is not anxious.     ED Results / Procedures / Treatments   Labs (all labs ordered are listed, but only abnormal results are displayed) Labs Reviewed  COMPREHENSIVE METABOLIC PANEL - Abnormal; Notable for the following components:      Result Value   Potassium 2.3 (*)    Glucose, Bld 111 (*)    Calcium 8.8 (*)    Total Protein 8.4 (*)    AST 43 (*)    ALT 74 (*)    Total Bilirubin 1.8 (*)    All other components within normal limits  SALICYLATE LEVEL - Abnormal; Notable for the following components:   Salicylate Lvl <7.0 (*)    All other components within normal limits  ACETAMINOPHEN LEVEL - Abnormal; Notable for the following components:   Acetaminophen (Tylenol), Serum <10 (*)    All other components within normal limits  CBC - Abnormal; Notable for the following components:   WBC 15.7 (*)    RDW 16.3 (*)    Platelets 474 (*)    nRBC 12.0 (*)    All other components within normal limits  RAPID URINE DRUG SCREEN, HOSP PERFORMED - Abnormal; Notable for the following components:   Cocaine POSITIVE (*)    All other components within normal limits  MAGNESIUM - Abnormal; Notable for the following components:   Magnesium 2.5 (*)    All other components within normal limits  RESPIRATORY PANEL BY RT PCR (FLU A&B, COVID)  ETHANOL  URINALYSIS, ROUTINE W REFLEX MICROSCOPIC  HIV ANTIBODY (ROUTINE TESTING W REFLEX)  CBC WITH DIFFERENTIAL/PLATELET  COMPREHENSIVE METABOLIC PANEL    EKG EKG Interpretation  Date/Time:  Wednesday June 22 2019 16:54:56 EST Ventricular Rate:  75 PR Interval:    QRS Duration: 110 QT Interval:  454 QTC Calculation: 508 R Axis:   90 Text Interpretation: Sinus rhythm Consider right atrial enlargement  Borderline right axis deviation Prolonged QT interval U waves present Confirmed by Pricilla Loveless [id number]) on 06/22/2019 5:56:52 PM   Radiology CT Head Wo Contrast  Result Date: 06/22/2019 CLINICAL DATA:  Syncopal episode 2-3 days prior with right head injury.  Headache. EXAM: CT HEAD WITHOUT CONTRAST TECHNIQUE: Contiguous axial images were obtained from the base of the skull through the vertex without intravenous contrast. COMPARISON:  02/10/2017 head CT. FINDINGS: Brain: No evidence of parenchymal hemorrhage or extra-axial fluid collection. No mass lesion, mass effect, or midline shift. No CT evidence of acute infarction. Cerebral volume is age appropriate. No ventriculomegaly. Vascular: No acute abnormality. Skull: No evidence of calvarial fracture. Sinuses/Orbits: No fluid levels. Mild mucoperiosteal thickening in the right frontal sinus and bilateral ethmoidal air cells. Other:  The mastoid air cells are unopacified. IMPRESSION: 1. No evidence of acute intracranial abnormality. No evidence of calvarial fracture. 2. Mild chronic appearing paranasal sinusitis. Electronically Signed   By: Ilona Sorrel M.D.   On: 06/22/2019 19:02    Procedures .Critical Care Performed by: Sherwood Gambler, MD Authorized by: Sherwood Gambler, MD   Critical care provider statement:    Critical care time (minutes):  30   Critical care time was exclusive of:  Separately billable procedures and treating other patients   Critical care was necessary to treat or prevent imminent or life-threatening deterioration of the following conditions:  Metabolic crisis   Critical care was time spent personally by me on the following activities:  Discussions with consultants, evaluation of patient's response to treatment, examination of patient, ordering and performing treatments and interventions, ordering and review of laboratory studies, ordering and review of radiographic studies, pulse oximetry, re-evaluation of patient's condition,  obtaining history from patient or surrogate and review of old charts   (including critical care time)  Medications Ordered in ED Medications  acetaminophen (TYLENOL) tablet 650 mg (has no administration in time range)  aspirin EC tablet 325 mg (325 mg Oral Not Given 06/22/19 1742)  atorvastatin (LIPITOR) tablet 10 mg (10 mg Oral Not Given 06/22/19 1742)  loratadine (CLARITIN) tablet 10 mg (10 mg Oral Not Given 06/22/19 1742)  DULoxetine (CYMBALTA) DR capsule 30 mg (has no administration in time range)  potassium chloride 10 mEq in 100 mL IVPB (10 mEq Intravenous New Bag/Given 06/22/19 1829)  enoxaparin (LOVENOX) injection 40 mg (has no administration in time range)  0.9 % NaCl with KCl 40 mEq / L  infusion (has no administration in time range)  potassium chloride SA (KLOR-CON) CR tablet 40 mEq (40 mEq Oral Given 06/22/19 1737)    ED Course  I have reviewed the triage vital signs and the nursing notes.  Pertinent labs & imaging results that were available during my care of the patient were reviewed by me and considered in my medical decision making (see chart for details).    MDM Rules/Calculators/A&P                      Patient presents for psychiatric complaints but is found to have significant hypokalemia.  On further review, the patient does endorse that he probably had a syncopal episode a few days ago causing a head injury.  I think you will need further work-up to be medically cleared for psych.  He was given IV and oral potassium.  Magnesium is okay.  Slightly prolonged QTC but also some U waves.  Hemodynamically stable but will need close monitoring.  Dr. Olevia Bowens to admit. Final Clinical Impression(s) / ED Diagnoses Final diagnoses:  Hypokalemia    Rx / DC Orders ED Discharge Orders    None       Sherwood Gambler, MD 06/22/19 1950

## 2019-06-22 NOTE — ED Notes (Signed)
Date and time results received: 06/22/19 8:17 PM Test: coronavirus Critical Value: positive Name of Provider Notified: [email or web address] Orders Received? Or Actions Taken?: md notified

## 2019-06-22 NOTE — ED Notes (Signed)
Date and time results received: 06/22/19 1700 (use smartphrase ".now" to insert current time)  Test: kt Critical Value: 2.3  Name of Provider Notified: Criss Alvine  Orders Received? Or Actions Taken?:

## 2019-06-22 NOTE — ED Notes (Signed)
Pt taken to CT.

## 2019-06-22 NOTE — ED Notes (Signed)
[facility] number [phone]

## 2019-06-22 NOTE — H&P (Signed)
History and Physical    Ethan Moore:[id number] DOB: Oct 18, 1960 DOA: 06/22/2019  PCP: [facility] (  Patient coming from: Home.  I have personally briefly reviewed patient's old medical records in [facility]  Chief Complaint: Depression and suicidal ideation.  HPI: Ethan Moore is a 59 y.o. male with medical history significant for history of back/neck injury, chronic back pain, chronic back pain, depression, PTSD, history of dizziness who is coming to the emergency department due to having suicidal ideations.  He has been seen by behavioral health and is a scheduled to be transferred to behavioral health at the [place].  Incidentally, the patient was found to be hypokalemic and tested Covid positive.  He mentions that he was recently incarcerated, but tested negative twice there.  However, this is where he thinks he was exposed to SARS 2.  He denies fever, chills, sore throat, rhinorrhea, wheezing or hemoptysis.  No chest pain, dyspnea, palpitations, diaphoresis, PND, orthopnea or pitting edema of the lower extremities.  Denies abdominal pain, nausea, vomiting, diarrhea, constipation, melena or hematochezia.  No dysuria, frequency or hematuria.  ED Course: Initial vital signs temperature 98.4 F, pulse 84, respirations 16, blood pressure 124/80 mmHg and O2 sat 100% on room air.  He did receive IV fluids and potassium replacement orally/parenteral.  Urine toxicology was positive for cocaine.  White count is 15.7, hemoglobin 15.6 g/dL and platelets 811.  Unremarkable LDL alcohol, acetaminophen and salicylate levels.  CMP shows a potassium of 2.30 mmol/L.  Renal function is normal.  Total protein is 8.4 and albumin 4.4 g/dL.  AST 43 ALT 74 units/L.  Total bilirubin was 1.8 mg/dL. His chest radiograph shows vague patchy opacities.  Review of Systems: As per HPI otherwise 10 point review of systems negative.   Past Medical History:  Diagnosis Date  . Back injury   . Chronic back  pain   . Chronic neck pain   . Depression   . Dizziness   . Neck injury   . PTSD (post-traumatic stress disorder)     History reviewed. No pertinent surgical history.   reports that he has been smoking cigarettes. He has never used smokeless tobacco. He reports current alcohol use. He reports that he does not use drugs.  No Known Allergies  Family medical history Father-emphysema.  Prior to Admission medications   Medication Sig Start Date End Date Taking? Authorizing Provider  aspirin EC 325 MG tablet Take 325 mg by mouth daily.    [provider]  atorvastatin (LIPITOR) 10 MG tablet Take 1 tablet (10 mg total) by mouth daily. 02/12/17   Catarina Hartshorn, MD  cetirizine (ZYRTEC) 10 MG tablet Take 10 mg by mouth daily.    [provider]  cyclobenzaprine (FLEXERIL) 10 MG tablet Take 10 mg by mouth 3 (three) times daily as needed for muscle spasms.    [provider]  diclofenac (VOLTAREN) 75 MG EC tablet Take 75 mg by mouth 2 (two) times daily.     [provider]  DULoxetine (CYMBALTA) 30 MG capsule Take 30 mg by mouth daily as needed (PTSD).     [provider]  Multiple Vitamins-Minerals (MULTIVITAMINS THER. W/MINERALS) TABS Take 1 tablet by mouth daily.      [provider]    Physical Exam: Vitals:   06/22/19 1745 06/22/19 1800 06/22/19 1801 06/22/19 1830  BP:  (!) 132/91 (!) 132/91 (!) 141/78  Pulse: 75 82  98  Resp: (!) 30 (!)  33  (!) 24  Temp:      TempSrc:      SpO2: 100% 100%  100%  Weight:      Height:        Constitutional: NAD, calm, comfortable Eyes: PERRL, lids and conjunctivae normal ENMT: Mucous membranes are moist. Posterior pharynx clear of any exudate or lesions. Neck: normal, supple, no masses, no thyromegaly Respiratory: clear to auscultation bilaterally, no wheezing, no crackles. Normal respiratory effort. No accessory muscle use.  Cardiovascular: Regular rate and rhythm, no murmurs / rubs / gallops.  No extremity edema. 2+ pedal pulses. No carotid bruits.  Abdomen: Soft, no tenderness, no masses palpated. No hepatosplenomegaly. Bowel sounds positive.  Musculoskeletal: no clubbing / cyanosis. Good ROM, no contractures. Normal muscle tone.  Skin: no rashes, lesions, ulcers on limited dermatological examination. Neurologic: CN 2-12 grossly intact. Sensation intact, DTR normal. Strength 5/5 in all 4.  Psychiatric: Normal judgment and insight. Alert and oriented x 3.    Labs on Admission: I have personally reviewed following labs and imaging studies  CBC: Recent Labs  Lab 06/22/19 1544  WBC 15.7*  HGB 15.6  HCT 47.5  MCV 93.0  PLT 250*   Basic Metabolic Panel: Recent Labs  Lab 06/22/19 1544  NA 136  K 2.3*  CL 98  CO2 24  GLUCOSE 111*  BUN 14  CREATININE 1.22  CALCIUM 8.8*  MG 2.5*   GFR: Estimated Creatinine Clearance: 92 mL/min (by C-G formula based on SCr of 1.22 mg/dL). Liver Function Tests: Recent Labs  Lab 06/22/19 1544  AST 43*  ALT 74*  ALKPHOS 109  BILITOT 1.8*  PROT 8.4*  ALBUMIN 4.4   No results for input(s): LIPASE, AMYLASE in the last 168 hours. No results for input(s): AMMONIA in the last 168 hours. Coagulation Profile: No results for input(s): INR, PROTIME in the last 168 hours. Cardiac Enzymes: No results for input(s): CKTOTAL, CKMB, CKMBINDEX, TROPONINI in the last 168 hours. BNP (last 3 results) No results for input(s): PROBNP in the last 8760 hours. HbA1C: No results for input(s): HGBA1C in the last 72 hours. CBG: No results for input(s): GLUCAP in the last 168 hours. Lipid Profile: No results for input(s): CHOL, HDL, LDLCALC, TRIG, CHOLHDL, LDLDIRECT in the last 72 hours. Thyroid Function Tests: No results for input(s): TSH, T4TOTAL, FREET4, T3FREE, THYROIDAB in the last 72 hours. Anemia Panel: No results for input(s): VITAMINB12, FOLATE, FERRITIN, TIBC, IRON, RETICCTPCT in the last 72 hours. Urine analysis:    Component Value  Date/Time   COLORURINE YELLOW 02/10/2017 0512   APPEARANCEUR CLEAR 02/10/2017 0512   LABSPEC 1.016 02/10/2017 0512   PHURINE 5.0 02/10/2017 0512   GLUCOSEU NEGATIVE 02/10/2017 0512   HGBUR NEGATIVE 02/10/2017 0512   BILIRUBINUR NEGATIVE 02/10/2017 0512   KETONESUR NEGATIVE 02/10/2017 0512   PROTEINUR NEGATIVE 02/10/2017 0512   UROBILINOGEN 0.2 03/08/2013 1151   NITRITE NEGATIVE 02/10/2017 0512   LEUKOCYTESUR TRACE (A) 02/10/2017 0512    Radiological Exams on Admission: CT Head Wo Contrast  Result Date: 06/22/2019 CLINICAL DATA:  Syncopal episode 2-3 days prior with right head injury. Headache. EXAM: CT HEAD WITHOUT CONTRAST TECHNIQUE: Contiguous axial images were obtained from the base of the skull through the vertex without intravenous contrast. COMPARISON:  02/10/2017 head CT. FINDINGS: Brain: No evidence of parenchymal hemorrhage or extra-axial fluid collection. No mass lesion, mass effect, or midline shift. No CT evidence of acute infarction. Cerebral volume is age appropriate. No ventriculomegaly. Vascular: No acute abnormality. Skull: No  evidence of calvarial fracture. Sinuses/Orbits: No fluid levels. Mild mucoperiosteal thickening in the right frontal sinus and bilateral ethmoidal air cells. Other:  The mastoid air cells are unopacified. IMPRESSION: 1. No evidence of acute intracranial abnormality. No evidence of calvarial fracture. 2. Mild chronic appearing paranasal sinusitis. Electronically Signed   By: Delbert Phenix M.D.   On: 06/22/2019 19:02    EKG: Independently reviewed. Vent. rate 75 BPM PR interval * ms QRS duration 110 ms QT/QTc 454/508 ms P-R-T axes 65 90 74 Sinus rhythm Consider right atrial enlargement Borderline right axis deviation Prolonged QT interval U waves present  Assessment/Plan Principal Problem:   Hypokalemia Observation/telemetry. Continue potassium replacement. Follow-up potassium level in a.m.  Active Problems:   Prolonged QT  interval Continue electrolyte correction. Follow-up EKG.    Abnormal LFTs   Hyperbilirubinemia Follow-up LFTs in a.m.    COVID-19 virus infection For the moment he is asymptomatic. O2 sat is normal. Check inflammatory markers.    Hypocalcemia Check vitamin D level.    Leukocytosis Likely due to COVID-19. Follow-up WBC.    PTSD (post-traumatic stress disorder)   Suicidal ideations Continue Cymbalta 30 mg p.o.  Daily. To be transferred to behavioral health at the [place].    DVT prophylaxis: Lovenox SQ. Code Status: Full code. Family Communication: Disposition Plan: Observation for IV hydration, electrolyte replacement and one-to-one monitoring. Consults called: [facility] Admission status: Observation/telemetry.   Bobette Mo MD [facility]  If 7PM-7AM, please contact night-coverage [email or web address]  06/22/2019, 8:31 PM   This document was prepared using Dragon voice recognition software and may contain some unintended transcription errors.

## 2019-06-22 NOTE — ED Triage Notes (Signed)
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 He states he called the [facility] hotline and told that they are starting a [facility] referral.  I talked with Suicide Hotline advocate named Kenyon Ana at 1 [phone] option 1. Kenyon Ana stated that their social worker is starting the paper work to have referral for patient now and that we should have our Child psychotherapist contact them.

## 2019-06-23 ENCOUNTER — Encounter (HOSPITAL_COMMUNITY): Payer: Self-pay | Admitting: Internal Medicine

## 2019-06-23 DIAGNOSIS — R9431 Abnormal electrocardiogram [ECG] [EKG]: Secondary | ICD-10-CM

## 2019-06-23 DIAGNOSIS — F4310 Post-traumatic stress disorder, unspecified: Secondary | ICD-10-CM

## 2019-06-23 DIAGNOSIS — R45851 Suicidal ideations: Secondary | ICD-10-CM

## 2019-06-23 DIAGNOSIS — U071 COVID-19: Secondary | ICD-10-CM

## 2019-06-23 DIAGNOSIS — R7989 Other specified abnormal findings of blood chemistry: Secondary | ICD-10-CM | POA: Diagnosis present

## 2019-06-23 DIAGNOSIS — R945 Abnormal results of liver function studies: Secondary | ICD-10-CM | POA: Diagnosis not present

## 2019-06-23 DIAGNOSIS — E876 Hypokalemia: Secondary | ICD-10-CM | POA: Diagnosis not present

## 2019-06-23 LAB — COMPREHENSIVE METABOLIC PANEL
ALT: 59 U/L — ABNORMAL HIGH (ref 0–44)
AST: 33 U/L (ref 15–41)
Albumin: 3.3 g/dL — ABNORMAL LOW (ref 3.5–5.0)
Alkaline Phosphatase: 83 U/L (ref 38–126)
Anion gap: 12 (ref 5–15)
BUN: 13 mg/dL (ref 6–20)
CO2: 24 mmol/L (ref 22–32)
Calcium: 7.9 mg/dL — ABNORMAL LOW (ref 8.9–10.3)
Chloride: 99 mmol/L (ref 98–111)
Creatinine, Ser: 1.14 mg/dL (ref 0.61–1.24)
GFR calc Af Amer: >60 mL/min (ref >60)
GFR calc non Af Amer: >60 mL/min (ref >60)
Glucose, Bld: 111 mg/dL — ABNORMAL HIGH (ref 70–99)
Potassium: 3.3 mmol/L — ABNORMAL LOW (ref 3.5–5.1)
Sodium: 135 mmol/L (ref 135–145)
Total Bilirubin: 1.3 mg/dL — ABNORMAL HIGH (ref 0.3–1.2)
Total Protein: 6.4 g/dL — ABNORMAL LOW (ref 6.5–8.1)

## 2019-06-23 LAB — BASIC METABOLIC PANEL
Anion gap: 10 (ref 5–15)
BUN: 15 mg/dL (ref 6–20)
CO2: 26 mmol/L (ref 22–32)
Calcium: 8.1 mg/dL — ABNORMAL LOW (ref 8.9–10.3)
Chloride: 99 mmol/L (ref 98–111)
Creatinine, Ser: 1.16 mg/dL (ref 0.61–1.24)
GFR calc Af Amer: >60 mL/min (ref >60)
GFR calc non Af Amer: >60 mL/min (ref >60)
Glucose, Bld: 115 mg/dL — ABNORMAL HIGH (ref 70–99)
Potassium: 2.8 mmol/L — ABNORMAL LOW (ref 3.5–5.1)
Sodium: 135 mmol/L (ref 135–145)

## 2019-06-23 LAB — URINALYSIS, ROUTINE W REFLEX MICROSCOPIC
Bilirubin Urine: NEGATIVE
Glucose, UA: NEGATIVE mg/dL
Hgb urine dipstick: NEGATIVE
Ketones, ur: NEGATIVE mg/dL
Leukocytes,Ua: NEGATIVE
Nitrite: NEGATIVE
Protein, ur: NEGATIVE mg/dL
Specific Gravity, Urine: 1.02 (ref 1.005–1.030)
pH: 6 (ref 5.0–8.0)

## 2019-06-23 LAB — FERRITIN: Ferritin: 67 ng/mL (ref 24–336)

## 2019-06-23 LAB — HIV ANTIBODY (ROUTINE TESTING W REFLEX): HIV Screen 4th Generation wRfx: NONREACTIVE

## 2019-06-23 LAB — CBC WITH DIFFERENTIAL/PLATELET
Abs Immature Granulocytes: 0.14 10*3/uL — ABNORMAL HIGH (ref 0.00–0.07)
Basophils Absolute: 0.1 10*3/uL (ref 0.0–0.1)
Basophils Relative: 1 %
Eosinophils Absolute: 0.1 10*3/uL (ref 0.0–0.5)
Eosinophils Relative: 1 %
HCT: 40.5 % (ref 39.0–52.0)
Hemoglobin: 13.1 g/dL (ref 13.0–17.0)
Immature Granulocytes: 1 %
Lymphocytes Relative: 17 %
Lymphs Abs: 2.1 10*3/uL (ref 0.7–4.0)
MCH: 30.6 pg (ref 26.0–34.0)
MCHC: 32.3 g/dL (ref 30.0–36.0)
MCV: 94.6 fL (ref 80.0–100.0)
Monocytes Absolute: 1.2 10*3/uL — ABNORMAL HIGH (ref 0.1–1.0)
Monocytes Relative: 10 %
Neutro Abs: 9.2 10*3/uL — ABNORMAL HIGH (ref 1.7–7.7)
Neutrophils Relative %: 70 %
Platelets: 396 10*3/uL (ref 150–400)
RBC: 4.28 MIL/uL (ref 4.22–5.81)
RDW: 15.1 % (ref 11.5–15.5)
WBC: 12.8 10*3/uL — ABNORMAL HIGH (ref 4.0–10.5)
nRBC: 6 % — ABNORMAL HIGH (ref 0.0–0.2)

## 2019-06-23 LAB — MRSA PCR SCREENING: MRSA by PCR: NEGATIVE

## 2019-06-23 LAB — TSH: TSH: 1.826 u[IU]/mL (ref 0.350–4.500)

## 2019-06-23 LAB — C-REACTIVE PROTEIN: CRP: 0.8 mg/dL (ref <1.0)

## 2019-06-23 LAB — HEPATITIS B SURFACE ANTIGEN: Hepatitis B Surface Ag: NONREACTIVE

## 2019-06-23 LAB — D-DIMER, QUANTITATIVE: D-Dimer, Quant: 1.26 ug/mL-FEU — ABNORMAL HIGH (ref 0.00–0.50)

## 2019-06-23 LAB — FIBRINOGEN: Fibrinogen: 400 mg/dL (ref 210–475)

## 2019-06-23 LAB — PROCALCITONIN: Procalcitonin: <0.1 ng/mL

## 2019-06-23 LAB — VITAMIN D 25 HYDROXY (VIT D DEFICIENCY, FRACTURES): Vit D, 25-Hydroxy: 18.52 ng/mL — ABNORMAL LOW (ref 30–100)

## 2019-06-23 LAB — ABO/RH: ABO/RH(D): B POS

## 2019-06-23 LAB — LACTATE DEHYDROGENASE: LDH: 248 U/L — ABNORMAL HIGH (ref 98–192)

## 2019-06-23 MED ORDER — DULOXETINE HCL 30 MG PO CPEP
30.0000 mg | ORAL_CAPSULE | Freq: Every day | ORAL | Status: DC
  Administered 2019-06-23 – 2019-06-25 (×3): 30 mg via ORAL
  Filled 2019-06-23 (×3): qty 1

## 2019-06-23 MED ORDER — POTASSIUM CHLORIDE IN NACL 40-0.9 MEQ/L-% IV SOLN
INTRAVENOUS | Status: DC
  Administered 2019-06-23: 09:00:00 125 mL/h via INTRAVENOUS

## 2019-06-23 MED ORDER — ASCORBIC ACID 500 MG PO TABS
500.0000 mg | ORAL_TABLET | Freq: Two times a day (BID) | ORAL | Status: DC
  Administered 2019-06-23 – 2019-06-25 (×5): 500 mg via ORAL
  Filled 2019-06-23 (×5): qty 1

## 2019-06-23 MED ORDER — ZINC SULFATE 220 (50 ZN) MG PO CAPS
220.0000 mg | ORAL_CAPSULE | Freq: Every day | ORAL | Status: DC
  Administered 2019-06-23 – 2019-06-25 (×3): 220 mg via ORAL
  Filled 2019-06-23 (×3): qty 1

## 2019-06-23 MED ORDER — CHLORHEXIDINE GLUCONATE CLOTH 2 % EX PADS
6.0000 | MEDICATED_PAD | Freq: Every day | CUTANEOUS | Status: DC
  Administered 2019-06-23 – 2019-06-25 (×2): 6 via TOPICAL

## 2019-06-23 MED ORDER — POTASSIUM CHLORIDE CRYS ER 20 MEQ PO TBCR
20.0000 meq | EXTENDED_RELEASE_TABLET | Freq: Every day | ORAL | Status: DC
  Administered 2019-06-23 – 2019-06-25 (×3): 20 meq via ORAL
  Filled 2019-06-23 (×3): qty 1

## 2019-06-23 MED ORDER — ADULT MULTIVITAMIN W/MINERALS CH
1.0000 | ORAL_TABLET | Freq: Every day | ORAL | Status: DC
  Administered 2019-06-23 – 2019-06-25 (×3): 1 via ORAL
  Filled 2019-06-23 (×3): qty 1

## 2019-06-23 NOTE — Consult Note (Signed)
Telepsych Consultation   Reason for Consult:  "depression and suicidal ideation; medically stable." Referring Physician:  Dr Robb Matar Location of Patient: [facility] Location of Provider: [facility]  Patient Identification: Ethan Moore MRN:  [id number] Principal Diagnosis: Hypokalemia Diagnosis:  Principal Problem:   Hypokalemia Active Problems:   PTSD (post-traumatic stress disorder)   Suicidal ideations   COVID-19 virus infection   Hyperbilirubinemia   Hypocalcemia   Leukocytosis   Prolonged QT interval   Abnormal LFTs   Total Time spent with patient: 30 minutes  Subjective:   Ethan Moore is a 59 y.o. male patient.  Patient assessed by nurse practitioner.  Patient alert and oriented, answers appropriately.  Patient states "I am trying to get to the [place] where I can get my life back on track, I would like to get placed in the [place] so that I can have my own place and stop while I fell." Patient reports on yesterday he called primary care and "was told to get a ride to the emergency room so that I can get to the [place]." Patient currently denies suicidal and homicidal ideations.  Patient denies auditory and visual hallucinations.  Patient denies access to weapons.  Patient reports recently has been "staying in a hotel room that the [place] put me up in."  Patient reports "I am a homeless veteran."  Patient denies alcohol and substance use. Patient gives verbal consent to contact his mother, Randy Castrejon phone number [phone] Spoke with patient's mother Laden Fieldhouse: Patient's mother denies concern for patient safety.  Patient's mother denies access to weapons.  Patient's mother reports "he has been going to the [place] to get his medications but he does not usually come here."  HPI: Patient presents to emergency room [facility]  Past Psychiatric History: PTSD, cocaine abuse  Risk to Self: Suicidal Ideation: Yes-Currently  Present Suicidal Intent: No-Not Currently/Within Last 6 Months Is patient at risk for suicide?: Yes Suicidal Plan?: No-Not Currently/Within Last 6 Months What has been your use of drugs/alcohol within the last 12 months?: none due to in prison past 55 days How many times?: 2 Other Self Harm Risks: widower, older, male, current SI, past attempts Triggers for Past Attempts: Other (Comment)(after wife's death) Intentional Self Injurious Behavior: None Risk to Others: Homicidal Ideation: Yes-Currently Present("little bit toward people who piss me off") Thoughts of Harm to Others: No Current Homicidal Intent: No Current Homicidal Plan: No Access to Homicidal Means: No History of harm to others?: No Assessment of Violence: In distant past Does patient have access to weapons?: No Criminal Charges Pending?: No Does patient have a court date: No Prior Inpatient Therapy: [facility] Prior Therapy Dates: 01/2019 [facility] Reason for Treatment: depression, SI Prior Outpatient Therapy: Prior Outpatient Therapy: Yes Prior Therapy Dates: ongoing [facility] Reason for Treatment: depression, SI Does patient have an ACCT team?: No Does patient have Intensive In-House Services?  : No Does patient have [facility] services? : No Does patient have P4CC services?: No  Past Medical History:  Past Medical History:  Diagnosis Date  . Back injury   . Chronic back pain   . Chronic neck pain   . Depression   . Dizziness   . Neck injury   . PTSD (post-traumatic stress disorder)    History reviewed. No pertinent surgical history. Family History:  Family History  Problem Relation Age of Onset  . Emphysema Father  Family Psychiatric  History: Denies Social History:  Social History   Substance and Sexual Activity  Alcohol Use Yes     Social History   Substance and Sexual Activity  Drug Use No    Social  History   Socioeconomic History  . Marital status: Married    Spouse name: Not on file  . Number of children: Not on file  . Years of education: Not on file  . Highest education level: Not on file  Occupational History  . Not on file  Tobacco Use  . Smoking status: Current Some Day Smoker    Types: Cigarettes  . Smokeless tobacco: Never Used  Substance and Sexual Activity  . Alcohol use: Yes  . Drug use: No  . Sexual activity: Not on file  Other Topics Concern  . Not on file  Social History Narrative  . Not on file   Social Determinants of Health   Financial Resource Strain:   . Difficulty of Paying Living Expenses: Not on file  Food Insecurity:   . Worried About Programme researcher, broadcasting/film/video in the Last Year: Not on file  . Ran Out of Food in the Last Year: Not on file  Transportation Needs:   . Lack of Transportation (Medical): Not on file  . Lack of Transportation (Non-Medical): Not on file  Physical Activity:   . Days of Exercise per Week: Not on file  . Minutes of Exercise per Session: Not on file  Stress:   . Feeling of Stress : Not on file  Social Connections:   . Frequency of Communication with Friends and Family: Not on file  . Frequency of Social Gatherings with Friends and Family: Not on file  . Attends Religious Services: Not on file  . Active Member of Clubs or Organizations: Not on file  . Attends Banker Meetings: Not on file  . Marital Status: Not on file   Additional Social History:    Allergies:  No Known Allergies  Labs:  Results for orders placed or performed during the hospital encounter of 06/22/19 (from the past 48 hour(s))  Rapid urine drug screen (hospital performed)     Status: Abnormal   Collection Time: 06/22/19  3:15 PM  Result Value Ref Range   Opiates NONE DETECTED NONE DETECTED   Cocaine POSITIVE (A) NONE DETECTED   Benzodiazepines NONE DETECTED NONE DETECTED   Amphetamines NONE DETECTED NONE DETECTED    Tetrahydrocannabinol NONE DETECTED NONE DETECTED   Barbiturates NONE DETECTED NONE DETECTED    Comment: (NOTE) DRUG SCREEN FOR MEDICAL PURPOSES ONLY.  IF CONFIRMATION IS NEEDED FOR ANY PURPOSE, NOTIFY LAB WITHIN 5 DAYS. LOWEST DETECTABLE LIMITS FOR URINE DRUG SCREEN Drug Class                     Cutoff (ng/mL) Amphetamine and metabolites    1000 Barbiturate and metabolites    200 Benzodiazepine                 200 Tricyclics and metabolites     300 Opiates and metabolites        300 Cocaine and metabolites        300 THC                            50 Performed at [facility], [address], [place], [place] [address]   Respiratory Panel by RT PCR (Flu A&B, Covid) -  Nasopharyngeal Swab     Status: Abnormal   Collection Time: 06/22/19  3:36 PM   Specimen: Nasopharyngeal Swab  Result Value Ref Range   SARS Coronavirus 2 by RT PCR POSITIVE (A) NEGATIVE    Comment: RESULT CALLED TO, READ BACK BY AND VERIFIED WITH: B MYRICK,RN @2014  06/22/19 MKELLY (NOTE) SARS-CoV-2 target nucleic acids are DETECTED. SARS-CoV-2 RNA is generally detectable in upper respiratory specimens  during the acute phase of infection. Positive results are indicative of the presence of the identified virus, but do not rule out bacterial infection or co-infection with other pathogens not detected by the test. Clinical correlation with patient history and other diagnostic information is necessary to determine patient infection status. The expected result is Negative. Fact Sheet for Patients:  https://www.moore.com/https://www.fda.gov/media/142436/download Fact Sheet for Healthcare Providers: https://www.young.biz/https://www.fda.gov/media/142435/download This test is not yet approved or cleared by the [place] FDA and  has been authorized for detection and/or diagnosis of SARS-CoV-2 by FDA under an Emergency Use Authorization (EUA).  This EUA will remain in effect (meaning this test can be used) for  the duration of  the COVID-19 declaration under  Section 564(b)(1) of the Act, 21 U.S.C. section 360bbb-3(b)(1), unless the authorization is terminated or revoked sooner.    Influenza A by PCR NEGATIVE NEGATIVE   Influenza B by PCR NEGATIVE NEGATIVE    Comment: (NOTE) The Xpert Xpress SARS-CoV-2/FLU/RSV assay is intended as an aid in  the diagnosis of influenza from Nasopharyngeal swab specimens and  should not be used as a sole basis for treatment. Nasal washings and  aspirates are unacceptable for Xpert Xpress SARS-CoV-2/FLU/RSV  testing. Fact Sheet for Patients: https://www.moore.com/https://www.fda.gov/media/142436/download Fact Sheet for Healthcare Providers: https://www.young.biz/https://www.fda.gov/media/142435/download This test is not yet approved or cleared by the [place] FDA and  has been authorized for detection and/or diagnosis of SARS-CoV-2 by  FDA under an Emergency Use Authorization (EUA). This EUA will remain  in effect (meaning this test can be used) for the duration of the  Covid-19 declaration under Section 564(b)(1) of the Act, 21  U.S.C. section 360bbb-3(b)(1), unless the authorization is  terminated or revoked. Performed at [facility], [address] [place], [place] [address]   Comprehensive metabolic panel     Status: Abnormal   Collection Time: 06/22/19  3:44 PM  Result Value Ref Range   Sodium 136 135 - 145 mmol/L   Potassium 2.3 (LL) 3.5 - 5.1 mmol/L    Comment: CRITICAL RESULT CALLED TO, READ BACK BY AND VERIFIED WITH: GENTRY,R ON 06/22/19 AT 1700 BY LOY,C    Chloride 98 98 - 111 mmol/L   CO2 24 22 - 32 mmol/L   Glucose, Bld 111 (H) 70 - 99 mg/dL   BUN 14 6 - 20 mg/dL   Creatinine, Ser 1.911.22 0.61 - 1.24 mg/dL   Calcium 8.8 (L) 8.9 - 10.3 mg/dL   Total Protein 8.4 (H) 6.5 - 8.1 g/dL   Albumin 4.4 3.5 - 5.0 g/dL   AST 43 (H) 15 - 41 U/L   ALT 74 (H) 0 - 44 U/L   Alkaline Phosphatase 109 38 - 126 U/L   Total Bilirubin 1.8 (H) 0.3 - 1.2 mg/dL   GFR calc non Af Amer >60 >60 mL/min   GFR calc Af Amer >60 >60 mL/min   Anion gap 14 5  - 15    Comment: Performed at [facility], [address] [place], [place] [address]  Ethanol     Status: None   Collection Time: 06/22/19  3:44 PM  Result Value Ref Range   Alcohol, Ethyl (B) <10 <10 mg/dL    Comment: (NOTE) Lowest detectable limit for serum alcohol is 10 mg/dL. For medical purposes only. Performed at [facility], [address] [place], [place] [address]   Salicylate level     Status: Abnormal   Collection Time: 06/22/19  3:44 PM  Result Value Ref Range   Salicylate Lvl <7.0 (L) 7.0 - 30.0 mg/dL    Comment: Performed at [facility], [address] [place], [place] [address]  Acetaminophen level     Status: Abnormal   Collection Time: 06/22/19  3:44 PM  Result Value Ref Range   Acetaminophen (Tylenol), Serum <10 (L) 10 - 30 ug/mL    Comment: (NOTE) Therapeutic concentrations vary significantly. A range of 10-30 ug/mL  may be an effective concentration for many patients. However, some  are best treated at concentrations outside of this range. Acetaminophen concentrations >150 ug/mL at 4 hours after ingestion  and >50 ug/mL at 12 hours after ingestion are often associated with  toxic reactions. Performed at [facility], [address] [place], [place] [address]   cbc     Status: Abnormal   Collection Time: 06/22/19  3:44 PM  Result Value Ref Range   WBC 15.7 (H) 4.0 - 10.5 K/uL    Comment: WHITE COUNT CONFIRMED ON SMEAR   RBC 5.11 4.22 - 5.81 MIL/uL   Hemoglobin 15.6 13.0 - 17.0 g/dL   HCT 41.347.5 24.439.0 - 01.052.0 %   MCV 93.0 80.0 - 100.0 fL   MCH 30.5 26.0 - 34.0 pg   MCHC 32.8 30.0 - 36.0 g/dL   RDW 27.216.3 (H) 53.611.5 - 64.415.5 %   Platelets 474 (H) 150 - 400 K/uL   nRBC 12.0 (H) 0.0 - 0.2 %    Comment: Performed at [facility], [address] [place], [place] [address]  Magnesium     Status: Abnormal   Collection Time: 06/22/19  3:44 PM  Result Value Ref Range   Magnesium 2.5 (H) 1.7 - 2.4 mg/dL    Comment: Performed at [facility], [address]  [place], [place] [address]  Hepatitis B surface antigen     Status: None   Collection Time: 06/22/19  3:44 PM  Result Value Ref Range   Hepatitis B Surface Ag NON REACTIVE NON REACTIVE    Comment: Performed at [facility], [address] [place], [place] [address]  Ferritin     Status: None   Collection Time: 06/22/19  3:44 PM  Result Value Ref Range   Ferritin 67 24 - 336 ng/mL    Comment: Performed at [facility], [address] [place], [place] [address]  C-reactive protein     Status: None   Collection Time: 06/22/19  3:44 PM  Result Value Ref Range   CRP 0.8 <1.0 mg/dL    Comment: Performed at [facility], [address] [place], [place] [address]  MRSA PCR Screening     Status: None   Collection Time: 06/22/19 11:11 PM   Specimen: Nasopharyngeal  Result Value Ref Range   MRSA by PCR NEGATIVE NEGATIVE    Comment:        The GeneXpert MRSA Assay (FDA approved for NASAL specimens only), is one component of a comprehensive MRSA colonization surveillance program. It is not intended to diagnose MRSA infection nor to guide or monitor treatment for MRSA infections. Performed at [facility], [address] [place],  [place] [address]   ABO/Rh     Status: None   Collection Time: 06/22/19 11:53 PM  Result Value Ref Range   ABO/RH(D)      B POS Performed at [facility], [address] [place], [place] [address]   TSH     Status: None   Collection Time: 06/22/19 11:53 PM  Result Value Ref Range   TSH 1.826 0.350 - 4.500 uIU/mL    Comment: Performed by a 3rd Generation assay with a functional sensitivity of <=0.01 uIU/mL. Performed at [facility], [address] [place], [place] [address]   Basic metabolic panel     Status: Abnormal   Collection Time: 06/23/19 12:03 AM  Result Value Ref Range   Sodium 135 135 - 145 mmol/L   Potassium 2.8 (L) 3.5 - 5.1 mmol/L    Comment: DELTA CHECK NOTED   Chloride 99 98 - 111 mmol/L   CO2 26 22 - 32 mmol/L   Glucose, Bld 115 (H)  70 - 99 mg/dL   BUN 15 6 - 20 mg/dL   Creatinine, Ser 7.61 0.61 - 1.24 mg/dL   Calcium 8.1 (L) 8.9 - 10.3 mg/dL   GFR calc non Af Amer >60 >60 mL/min   GFR calc Af Amer >60 >60 mL/min   Anion gap 10 5 - 15    Comment: Performed at [facility], [address], [place], [place] [address]  Procalcitonin     Status: None   Collection Time: 06/23/19 12:03 AM  Result Value Ref Range   Procalcitonin <0.10 ng/mL    Comment:        Interpretation: PCT (Procalcitonin) <= 0.5 ng/mL: Systemic infection (sepsis) is not likely. Local bacterial infection is possible. (NOTE)       Sepsis PCT Algorithm           Lower Respiratory Tract                                      Infection PCT Algorithm    ----------------------------     ----------------------------         PCT < 0.25 ng/mL                PCT < 0.10 ng/mL         Strongly encourage             Strongly discourage   discontinuation of antibiotics    initiation of antibiotics    ----------------------------     -----------------------------       PCT 0.25 - 0.50 ng/mL            PCT 0.10 - 0.25 ng/mL               OR       >80% decrease in PCT            Discourage initiation of                                            antibiotics      Encourage discontinuation           of antibiotics    ----------------------------     -----------------------------         PCT >= 0.50 ng/mL  PCT 0.26 - 0.50 ng/mL               AND        <80% decrease in PCT             Encourage initiation of                                             antibiotics       Encourage continuation           of antibiotics    ----------------------------     -----------------------------        PCT >= 0.50 ng/mL                  PCT > 0.50 ng/mL               AND         increase in PCT                  Strongly encourage                                      initiation of antibiotics    Strongly encourage escalation           of antibiotics                                      -----------------------------                                           PCT <= 0.25 ng/mL                                                 OR                                        > 80% decrease in PCT                                     Discontinue / Do not initiate                                             antibiotics Performed at [facility], [address], [place], [place] [address]   Lactate dehydrogenase     Status: Abnormal   Collection Time: 06/23/19 12:03 AM  Result Value Ref Range   LDH 248 (H) 98 - 192 U/L    Comment: Performed at [facility], [address] [place], [place] [address]  Fibrinogen     Status: None   Collection Time: 06/23/19 12:03 AM  Result Value Ref Range   Fibrinogen 400 210 -  475 mg/dL    Comment: Performed at [facility], [address], [place], [place] [address]  D-dimer, quantitative (not at [facility])     Status: Abnormal   Collection Time: 06/23/19 12:03 AM  Result Value Ref Range   D-Dimer, Quant 1.26 (H) 0.00 - 0.50 ug/mL-FEU    Comment: (NOTE) At the manufacturer cut-off of 0.50 ug/mL FEU, this assay has been documented to exclude PE with a sensitivity and negative predictive value of 97 to 99%.  At this time, this assay has not been approved by the FDA to exclude DVT/VTE. Results should be correlated with clinical presentation. Performed at [facility], [address], [place], [place] [address]   HIV Antibody (routine testing w rflx)     Status: None   Collection Time: 06/23/19  4:16 AM  Result Value Ref Range   HIV Screen 4th Generation wRfx NON REACTIVE NON REACTIVE    Comment: Performed at [facility] [address], [place], [place] [address]  CBC with Differential/Platelet     Status: Abnormal   Collection Time: 06/23/19  4:16 AM  Result Value Ref Range   WBC 12.8 (H) 4.0 - 10.5 K/uL    Comment: WHITE COUNT CONFIRMED ON SMEAR   RBC 4.28 4.22 - 5.81 MIL/uL   Hemoglobin 13.1 13.0 - 17.0 g/dL   HCT  40.5 39.0 - 52.0 %   MCV 94.6 80.0 - 100.0 fL   MCH 30.6 26.0 - 34.0 pg   MCHC 32.3 30.0 - 36.0 g/dL   RDW 15.1 11.5 - 15.5 %   Platelets 396 150 - 400 K/uL   nRBC 6.0 (H) 0.0 - 0.2 %   Neutrophils Relative % 70 %   Neutro Abs 9.2 (H) 1.7 - 7.7 K/uL   Lymphocytes Relative 17 %   Lymphs Abs 2.1 0.7 - 4.0 K/uL   Monocytes Relative 10 %   Monocytes Absolute 1.2 (H) 0.1 - 1.0 K/uL   Eosinophils Relative 1 %   Eosinophils Absolute 0.1 0.0 - 0.5 K/uL   Basophils Relative 1 %   Basophils Absolute 0.1 0.0 - 0.1 K/uL   RBC Morphology NUCLEATED RBCS PRESENT    Immature Granulocytes 1 %   Abs Immature Granulocytes 0.14 (H) 0.00 - 0.07 K/uL   Polychromasia PRESENT     Comment: Performed at [facility], [address], [place], [place] [address]  Comprehensive metabolic panel     Status: Abnormal   Collection Time: 06/23/19  4:16 AM  Result Value Ref Range   Sodium 135 135 - 145 mmol/L   Potassium 3.3 (L) 3.5 - 5.1 mmol/L   Chloride 99 98 - 111 mmol/L   CO2 24 22 - 32 mmol/L   Glucose, Bld 111 (H) 70 - 99 mg/dL   BUN 13 6 - 20 mg/dL   Creatinine, Ser 1.14 0.61 - 1.24 mg/dL   Calcium 7.9 (L) 8.9 - 10.3 mg/dL   Total Protein 6.4 (L) 6.5 - 8.1 g/dL   Albumin 3.3 (L) 3.5 - 5.0 g/dL   AST 33 15 - 41 U/L   ALT 59 (H) 0 - 44 U/L   Alkaline Phosphatase 83 38 - 126 U/L   Total Bilirubin 1.3 (H) 0.3 - 1.2 mg/dL   GFR calc non Af Amer >60 >60 mL/min   GFR calc Af Amer >60 >60 mL/min   Anion gap 12 5 - 15    Comment: Performed at [facility], [address], [place], [place] [address]  Urinalysis, Routine w reflex microscopic     Status: Abnormal   Collection Time: 06/23/19  6:30 AM  Result Value Ref Range   Color, Urine AMBER (A) YELLOW    Comment: BIOCHEMICALS MAY BE AFFECTED BY COLOR   APPearance CLEAR CLEAR   Specific Gravity, Urine 1.020 1.005 - 1.030   pH 6.0 5.0 - 8.0   Glucose, UA NEGATIVE NEGATIVE mg/dL   Hgb urine dipstick NEGATIVE NEGATIVE   Bilirubin Urine NEGATIVE NEGATIVE    Ketones, ur NEGATIVE NEGATIVE mg/dL   Protein, ur NEGATIVE NEGATIVE mg/dL   Nitrite NEGATIVE NEGATIVE   Leukocytes,Ua NEGATIVE NEGATIVE    Comment: Performed at [facility], [address] [place], [place] [address]    Medications:  Current Facility-Administered Medications  Medication Dose Route Frequency Provider Last Rate Last Admin  . acetaminophen (TYLENOL) tablet 650 mg  650 mg Oral Q4H PRN Bobette Mo, MD      . ascorbic acid (VITAMIN C) tablet 500 mg  500 mg Oral BID Vassie Loll, MD   500 mg at 06/23/19 1055  . aspirin EC tablet 325 mg  325 mg Oral Daily Bobette Mo, MD   325 mg at 06/23/19 1054  . atorvastatin (LIPITOR) tablet 10 mg  10 mg Oral Daily Bobette Mo, MD      . Chlorhexidine Gluconate Cloth 2 % PADS 6 each  6 each Topical Daily Vassie Loll, MD   6 each at 06/23/19 1101  . cyclobenzaprine (FLEXERIL) tablet 10 mg  10 mg Oral TID PRN Bobette Mo, MD      . DULoxetine (CYMBALTA) DR capsule 30 mg  30 mg Oral Daily Vassie Loll, MD   30 mg at 06/23/19 1100  . enoxaparin (LOVENOX) injection 40 mg  40 mg Subcutaneous Q24H Bobette Mo, MD   40 mg at 06/22/19 2006  . loratadine (CLARITIN) tablet 10 mg  10 mg Oral Daily Bobette Mo, MD   10 mg at 06/23/19 1054  . multivitamin with minerals tablet 1 tablet  1 tablet Oral Daily Vassie Loll, MD   1 tablet at 06/23/19 1054  . potassium chloride SA (KLOR-CON) CR tablet 20 mEq  20 mEq Oral Daily Vassie Loll, MD   20 mEq at 06/23/19 1054  . zinc sulfate capsule 220 mg  220 mg Oral Daily Vassie Loll, MD   220 mg at 06/23/19 1054    Musculoskeletal: Strength & Muscle Tone: within normal limits Gait & Station: normal Patient leans: N/A  Psychiatric Specialty Exam: Physical Exam  Nursing note and vitals reviewed. Constitutional: He is oriented to person, place, and time. He appears well-developed.  HENT:  Head: Normocephalic.  Cardiovascular: Normal rate.   Respiratory: Effort normal.  Neurological: He is alert and oriented to person, place, and time.  Psychiatric: He has a normal mood and affect. His speech is normal and behavior is normal. Judgment and thought content normal. Cognition and memory are normal.    Review of Systems  Constitutional: Negative.   HENT: Negative.   Eyes: Negative.   Respiratory: Negative.   Cardiovascular: Negative.   Gastrointestinal: Negative.   Genitourinary: Negative.   Musculoskeletal: Negative.   Skin: Negative.   Neurological: Negative.     Blood pressure 107/76, pulse 64, temperature 98.8 F (37.1 C), temperature source Axillary, resp. rate 15, height 6\' 4"  (1.93 m), weight 113.4 kg, SpO2 100 %.Body mass index is 30.43 kg/m.  General Appearance: Casual and Fairly Groomed  Eye Contact:  Good  Speech:  Clear and Coherent and Normal Rate  Volume:  Normal  Mood:  Anxious  Affect:  Appropriate and Congruent  Thought Process:  Coherent, Goal Directed and Descriptions of Associations: Intact  Orientation:  Full (Time, Place, and Person)  Thought Content:  WDL and Logical  Suicidal Thoughts:  No  Homicidal Thoughts:  No  Memory:  Immediate;   Good Recent;   Good Remote;   Good  Judgement:  Fair  Insight:  Fair  Psychomotor Activity:  Normal  Concentration:  Concentration: Good and Attention Span: Good  Recall:  Good  Fund of Knowledge:  Good  Language:  Good  Akathisia:  No  Handed:  Right  AIMS (if indicated):     Assets:  Communication Skills Desire for Improvement Financial Resources/Insurance Housing Intimacy Leisure Time Physical Health Resilience Social Support  ADL's:  Intact  Cognition:  WNL  Sleep:        Treatment Plan Summary: Plan Patient cleared by psychiatry.  Case discussed with Dr. Lucianne Muss.  Disposition: No evidence of imminent risk to self or others at present.   Patient does not meet criteria for psychiatric inpatient admission. Supportive therapy provided  about ongoing stressors. Discussed crisis plan, support from social network, calling 911, coming to the Emergency Department, and calling Suicide Hotline.  This service was provided via telemedicine using a 2-way, interactive audio and video technology.  Names of all persons participating in this telemedicine service and their role in this encounter. Name: Cleven Jansma Role: Patient  Name: Hester Mates telephone Role: Mother  Name: Berneice Heinrich Role: FNP    Patrcia Dolly, FNP 06/23/2019 12:01 PM

## 2019-06-23 NOTE — Progress Notes (Signed)
PROGRESS NOTE    Ethan Moore  ZOX:[id number] DOB: May 17, 1960 DOA: 06/22/2019 PCP: [facility]     Brief Narrative:  As per H&P written by written by Dr. Olevia Bowens on 06/22/2019 59 y.o. male with medical history significant for history of back/neck injury, chronic back pain, chronic back pain, depression, PTSD, history of dizziness who is coming to the emergency department due to having suicidal ideations.  He has been seen by behavioral health and is a scheduled to be transferred to behavioral health at the [place].  Incidentally, the patient was found to be hypokalemic and tested Covid positive.  He mentions that he was recently incarcerated, but tested negative twice there.  However, this is where he thinks he was exposed to SARS 2.  He denies fever, chills, sore throat, rhinorrhea, wheezing or hemoptysis.  No chest pain, dyspnea, palpitations, diaphoresis, PND, orthopnea or pitting edema of the lower extremities.  Denies abdominal pain, nausea, vomiting, diarrhea, constipation, melena or hematochezia.  No dysuria, frequency or hematuria.  ED Course: Initial vital signs temperature 98.4 F, pulse 84, respirations 16, blood pressure 124/80 mmHg and O2 sat 100% on room air.  He did receive IV fluids and potassium replacement orally/parenteral.  Urine toxicology was positive for cocaine.  White count is 15.7, hemoglobin 15.6 g/dL and platelets 474.  Unremarkable LDL alcohol, acetaminophen and salicylate levels.  CMP shows a potassium of 2.30 mmol/L.  Renal function is normal.  Total protein is 8.4 and albumin 4.4 g/dL.  AST 43 ALT 74 units/L.  Total bilirubin was 1.8 mg/dL. His chest radiograph shows vague patchy opacities.  Assessment & Plan: 1-hypokalemia mild dehydration -Patient received fluid resuscitation and electrolyte repletion -Potassium 3.3: -No nausea, no vomiting, no abdominal pain. -Tolerating diet and will be placed on daily potassium supplementation for  maintenance. -Continue to follow basic metabolic panel intermittently for further address electrolytes trend. -Will discontinue telemetry.  2-PTSD (post-traumatic stress disorder), depression and   Suicidal ideations -Patient was also found to be positive for cocaine, given concerns for substance induced behavioral disorder as well. -Behavioral health has been reconsulted to determine need of inpatient treatment and further evaluation and management. -No suicidal thoughts at this time.  But still feeling significantly depressed and asking for help and assistance with inpatient treatment and medication adjustment. -Will continue Cymbalta.  3-hyperlipidemia -Continue statins.  4-COVID-19 virus infection -Asymptomatic -No meeting criteria for remdesivir or steroids needs at this time -Denies cough, fever, shortness of breath, nausea or vomiting -Stable inflammatory markers. -Continue vitamin C, zinc and supportive care. -Patient has been advised and encouraged to follow 3 diabetes measures (washing hands frequently, wearing mask and keeping himself 6 feet apart).  5-Abnormal LFTs -Probably associated with the use of recreational drugs -After fluid resuscitation and supportive care LFTs essentially back to normal -Safe to continue the use of statins -Repeat LFTs at follow-up visit.  6-chronic back pain -Continue as needed Flexeril.  7-hypocalcemia -follow Vit D and TSH level. -currently asymptomatic.   DVT prophylaxis: Lovenox. Code Status: Full code Family Communication: No family at bedside Disposition Plan: Medically stable from internal medicine standpoint to further proceed psychiatry evaluation and further treatment.  Will recommend daily potassium supplementation along with multivitamin, zinc and vitamin C.  Patient positive for COVID-19, but completely asymptomatic.  Consultants:   Psychiatry service  Procedures:   See below for x-ray reports  Antimicrobials:   Anti-infectives (From admission, onward)   None      Subjective: Currently without suicidal thoughts,  even still depressed, no nausea, no vomiting, no fever, no shortness of breath.  Objective: Vitals:   06/23/19 0339 06/23/19 0700 06/23/19 0800 06/23/19 0900  BP:  107/79 108/77 107/76  Pulse: 64 62 60 64  Resp: 19 17 20 15   Temp: 98 F (36.7 C)  98.8 F (37.1 C)   TempSrc: Oral  Axillary   SpO2: 100% 100% 100% 100%  Weight:      Height:        Intake/Output Summary (Last 24 hours) at 06/23/2019 1038 Last data filed at 06/23/2019 0600 Gross per 24 hour  Intake 550.98 ml  Output 300 ml  Net 250.98 ml   Filed Weights   06/22/19 1513  Weight: 113.4 kg    Examination: General exam: Alert, awake, oriented x 3; feeling depressed, but without frank suicidal thoughts currently.  Still feeling the need of receiving assistance as inpatient from a behavioral health standpoint.  No shortness of breath, no nausea, no vomiting, no fever. Respiratory system: Clear to auscultation. Respiratory effort normal. Cardiovascular system:RRR. No murmurs, rubs, gallops. Gastrointestinal system: Abdomen is nondistended, soft and nontender. No organomegaly or masses felt. Normal bowel sounds heard. Central nervous system: Alert and oriented. No focal neurological deficits. Extremities: No C/C/E, +pedal pulses Skin: No rashes, lesions or ulcers Psychiatry: Judgement and insight appear normal. Mood & affect appropriate.     Data Reviewed: I have personally reviewed following labs and imaging studies  CBC: Recent Labs  Lab 06/22/19 1544 06/23/19 0416  WBC 15.7* 12.8*  NEUTROABS  --  9.2*  HGB 15.6 13.1  HCT 47.5 40.5  MCV 93.0 94.6  PLT 474* 396   Basic Metabolic Panel: Recent Labs  Lab 06/22/19 1544 06/23/19 0003 06/23/19 0416  NA 136 135 135  K 2.3* 2.8* 3.3*  CL 98 99 99  CO2 24 26 24   GLUCOSE 111* 115* 111*  BUN 14 15 13   CREATININE 1.22 1.16 1.14  CALCIUM 8.8* 8.1*  7.9*  MG 2.5*  --   --    GFR: Estimated Creatinine Clearance: 98.5 mL/min (by C-G formula based on SCr of 1.14 mg/dL).   Liver Function Tests: Recent Labs  Lab 06/22/19 1544 06/23/19 0416  AST 43* 33  ALT 74* 59*  ALKPHOS 109 83  BILITOT 1.8* 1.3*  PROT 8.4* 6.4*  ALBUMIN 4.4 3.3*   Anemia Panel: Recent Labs    06/22/19 1544  FERRITIN 67   Urine analysis:    Component Value Date/Time   COLORURINE AMBER (A) 06/23/2019 0630   APPEARANCEUR CLEAR 06/23/2019 0630   LABSPEC 1.020 06/23/2019 0630   PHURINE 6.0 06/23/2019 0630   GLUCOSEU NEGATIVE 06/23/2019 0630   HGBUR NEGATIVE 06/23/2019 0630   BILIRUBINUR NEGATIVE 06/23/2019 0630   KETONESUR NEGATIVE 06/23/2019 0630   PROTEINUR NEGATIVE 06/23/2019 0630   UROBILINOGEN 0.2 03/08/2013 1151   NITRITE NEGATIVE 06/23/2019 0630   LEUKOCYTESUR NEGATIVE 06/23/2019 0630    Recent Results (from the past 240 hour(s))  Respiratory Panel by RT PCR (Flu A&B, Covid) - Nasopharyngeal Swab     Status: Abnormal   Collection Time: 06/22/19  3:36 PM   Specimen: Nasopharyngeal Swab  Result Value Ref Range Status   SARS Coronavirus 2 by RT PCR POSITIVE (A) NEGATIVE Final    Comment: RESULT CALLED TO, READ BACK BY AND VERIFIED WITH: B MYRICK,RN @2014  06/22/19 MKELLY (NOTE) SARS-CoV-2 target nucleic acids are DETECTED. SARS-CoV-2 RNA is generally detectable in upper respiratory specimens  during the acute phase of infection. Positive  results are indicative of the presence of the identified virus, but do not rule out bacterial infection or co-infection with other pathogens not detected by the test. Clinical correlation with patient history and other diagnostic information is necessary to determine patient infection status. The expected result is Negative. Fact Sheet for Patients:  https://www.moore.com/ Fact Sheet for Healthcare Providers: https://www.young.biz/ This test is not yet approved or  cleared by the [place] FDA and  has been authorized for detection and/or diagnosis of SARS-CoV-2 by FDA under an Emergency Use Authorization (EUA).  This EUA will remain in effect (meaning this test can be used) for  the duration of  the COVID-19 declaration under Section 564(b)(1) of the Act, 21 U.S.C. section 360bbb-3(b)(1), unless the authorization is terminated or revoked sooner.    Influenza A by PCR NEGATIVE NEGATIVE Final   Influenza B by PCR NEGATIVE NEGATIVE Final    Comment: (NOTE) The Xpert Xpress SARS-CoV-2/FLU/RSV assay is intended as an aid in  the diagnosis of influenza from Nasopharyngeal swab specimens and  should not be used as a sole basis for treatment. Nasal washings and  aspirates are unacceptable for Xpert Xpress SARS-CoV-2/FLU/RSV  testing. Fact Sheet for Patients: https://www.moore.com/ Fact Sheet for Healthcare Providers: https://www.young.biz/ This test is not yet approved or cleared by the [place] FDA and  has been authorized for detection and/or diagnosis of SARS-CoV-2 by  FDA under an Emergency Use Authorization (EUA). This EUA will remain  in effect (meaning this test can be used) for the duration of the  Covid-19 declaration under Section 564(b)(1) of the Act, 21  U.S.C. section 360bbb-3(b)(1), unless the authorization is  terminated or revoked. Performed at [facility], [address], [place], [place] [address]   MRSA PCR Screening     Status: None   Collection Time: 06/22/19 11:11 PM   Specimen: Nasopharyngeal  Result Value Ref Range Status   MRSA by PCR NEGATIVE NEGATIVE Final    Comment:        The GeneXpert MRSA Assay (FDA approved for NASAL specimens only), is one component of a comprehensive MRSA colonization surveillance program. It is not intended to diagnose MRSA infection nor to guide or monitor treatment for MRSA infections. Performed at [facility], [address],  [place], [place] [address]      Radiology Studies: CT Head Wo Contrast  Result Date: 06/22/2019 CLINICAL DATA:  Syncopal episode 2-3 days prior with right head injury. Headache. EXAM: CT HEAD WITHOUT CONTRAST TECHNIQUE: Contiguous axial images were obtained from the base of the skull through the vertex without intravenous contrast. COMPARISON:  02/10/2017 head CT. FINDINGS: Brain: No evidence of parenchymal hemorrhage or extra-axial fluid collection. No mass lesion, mass effect, or midline shift. No CT evidence of acute infarction. Cerebral volume is age appropriate. No ventriculomegaly. Vascular: No acute abnormality. Skull: No evidence of calvarial fracture. Sinuses/Orbits: No fluid levels. Mild mucoperiosteal thickening in the right frontal sinus and bilateral ethmoidal air cells. Other:  The mastoid air cells are unopacified. IMPRESSION: 1. No evidence of acute intracranial abnormality. No evidence of calvarial fracture. 2. Mild chronic appearing paranasal sinusitis. Electronically Signed   By: Delbert Phenix M.D.   On: 06/22/2019 19:02   DG CHEST PORT 1 VIEW  Result Date: 06/22/2019 CLINICAL DATA:  COVID-19 positive. Hypokalemia. EXAM: PORTABLE CHEST 1 VIEW COMPARISON:  Radiograph 02/10/2017 FINDINGS: Minimal vague patchy opacities within both lungs. Normal heart size and mediastinal contours. No pulmonary edema or pleural effusion. No pneumothorax. No acute osseous  abnormalities. IMPRESSION: Minimal vague patchy opacities within both lungs, nonspecific, but suspicious for pneumonia in the setting of COVID-19. Electronically Signed   By: Narda Rutherford M.D.   On: 06/22/2019 23:56    Scheduled Meds: . vitamin C  500 mg Oral BID  . aspirin EC  325 mg Oral Daily  . atorvastatin  10 mg Oral Daily  . Chlorhexidine Gluconate Cloth  6 each Topical Daily  . DULoxetine  30 mg Oral Daily  . enoxaparin (LOVENOX) injection  40 mg Subcutaneous Q24H  . loratadine  10 mg Oral Daily  . multivitamin with  minerals  1 tablet Oral Daily  . potassium chloride  20 mEq Oral Daily  . zinc sulfate  220 mg Oral Daily   Continuous Infusions:   LOS: 0 days    Time spent: 30 minutes; still feeling depressed and asking for behavioral health assistance/support readjusting medications and receiving psychotherapy.  No chest pain, no nausea, no vomiting.  Patient is afebrile and tolerating diet.  No shortness of breath or cough.  In no acute distress and medically stable.     Vassie Loll, MD [facility] Pager [phone]  06/23/2019, 10:38 AM

## 2019-06-23 NOTE — Clinical Social Work Note (Signed)
[facility] Emergency notification emailed.    Pilot Prindle, Juleen China, LCSW

## 2019-06-24 DIAGNOSIS — R45851 Suicidal ideations: Secondary | ICD-10-CM | POA: Diagnosis not present

## 2019-06-24 DIAGNOSIS — U071 COVID-19: Secondary | ICD-10-CM | POA: Diagnosis not present

## 2019-06-24 DIAGNOSIS — E876 Hypokalemia: Secondary | ICD-10-CM | POA: Diagnosis not present

## 2019-06-24 DIAGNOSIS — F4310 Post-traumatic stress disorder, unspecified: Secondary | ICD-10-CM | POA: Diagnosis not present

## 2019-06-24 DIAGNOSIS — F1490 Cocaine use, unspecified, uncomplicated: Secondary | ICD-10-CM

## 2019-06-24 MED ORDER — POTASSIUM CHLORIDE CRYS ER 20 MEQ PO TBCR: 20.0000 meq | EXTENDED_RELEASE_TABLET | Freq: Every day | ORAL | 0 refills | Status: AC

## 2019-06-24 MED ORDER — DULOXETINE HCL 30 MG PO CPEP: 30.0000 mg | ORAL_CAPSULE | Freq: Every day | ORAL | 2 refills | Status: AC

## 2019-06-24 MED ORDER — VITAMIN D (ERGOCALCIFEROL) 1.25 MG (50000 UNIT) PO CAPS: 50000.0000 [IU] | ORAL_CAPSULE | ORAL | 0 refills | Status: AC

## 2019-06-24 MED ORDER — ASCORBIC ACID 500 MG PO TABS: 500.0000 mg | ORAL_TABLET | Freq: Two times a day (BID) | ORAL | 1 refills | Status: AC

## 2019-06-24 MED ORDER — ZINC SULFATE 220 (50 ZN) MG PO CAPS: 220.0000 mg | ORAL_CAPSULE | Freq: Every day | ORAL | 1 refills | Status: AC

## 2019-06-24 NOTE — Progress Notes (Signed)
Has denied suicidal thoughts or plans all day today.  Stated that he has not had suicidal thoughts but was told by the [facility] hospital to say that upon ED visit if he wants to get transferred to the [place].  Patient stated that he cannot return to where he has been living and he will go to live with his son in [place] and his son cannot get here until tomorrow morning.

## 2019-06-24 NOTE — Progress Notes (Signed)
Stated his mother is 58 years old and cannot pick him up and that he does not want to expose her to covid by going to her house
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
## 2019-06-25 DIAGNOSIS — F1490 Cocaine use, unspecified, uncomplicated: Secondary | ICD-10-CM

## 2019-06-25 DIAGNOSIS — U071 COVID-19: Secondary | ICD-10-CM

## 2019-06-25 DIAGNOSIS — E876 Hypokalemia: Secondary | ICD-10-CM

## 2019-06-25 DIAGNOSIS — F4310 Post-traumatic stress disorder, unspecified: Secondary | ICD-10-CM

## 2019-06-25 DIAGNOSIS — R45851 Suicidal ideations: Secondary | ICD-10-CM

## 2019-06-25 NOTE — Plan of Care (Signed)

## 2019-06-25 NOTE — Progress Notes (Signed)
Hemodynamically stable.  No suicidal thoughts.  Chart review/CT no clinical needs follow-up.  Patient is ready for discharge from IM standpoint. Discharge summary dictated on 04/22/2020 is appropriate, no changes required.    Vassie Loll MD [phone]

## 2021-09-20 ENCOUNTER — Encounter (HOSPITAL_COMMUNITY): Admission: EM | Disposition: E | Payer: Self-pay | Source: Home / Self Care

## 2021-09-20 ENCOUNTER — Other Ambulatory Visit: Payer: Self-pay

## 2021-09-20 ENCOUNTER — Inpatient Hospital Stay (HOSPITAL_COMMUNITY): Payer: Non-veteran care

## 2021-09-20 ENCOUNTER — Emergency Department (HOSPITAL_COMMUNITY): Payer: Non-veteran care | Admitting: Certified Registered Nurse Anesthetist

## 2021-09-20 ENCOUNTER — Inpatient Hospital Stay (HOSPITAL_COMMUNITY)
Admission: EM | Admit: 2021-09-20 | Discharge: 2021-10-03 | DRG: 957 | Disposition: E | Payer: Non-veteran care | Attending: General Surgery | Admitting: General Surgery

## 2021-09-20 DIAGNOSIS — E440 Moderate protein-calorie malnutrition: Secondary | ICD-10-CM | POA: Insufficient documentation

## 2021-09-20 DIAGNOSIS — R578 Other shock: Secondary | ICD-10-CM

## 2021-09-20 DIAGNOSIS — W3400XA Accidental discharge from unspecified firearms or gun, initial encounter: Principal | ICD-10-CM

## 2021-09-20 DIAGNOSIS — Z7189 Other specified counseling: Secondary | ICD-10-CM

## 2021-09-20 DIAGNOSIS — Z515 Encounter for palliative care: Secondary | ICD-10-CM

## 2021-09-20 DIAGNOSIS — S3609XA Other injury of spleen, initial encounter: Secondary | ICD-10-CM | POA: Diagnosis not present

## 2021-09-20 DIAGNOSIS — S36032A Major laceration of spleen, initial encounter: Secondary | ICD-10-CM | POA: Diagnosis present

## 2021-09-20 DIAGNOSIS — Z6832 Body mass index (BMI) 32.0-32.9, adult: Secondary | ICD-10-CM

## 2021-09-20 DIAGNOSIS — S21332A Puncture wound without foreign body of left front wall of thorax with penetration into thoracic cavity, initial encounter: Secondary | ICD-10-CM | POA: Diagnosis present

## 2021-09-20 DIAGNOSIS — S24104A Unspecified injury at T11-T12 level of thoracic spinal cord, initial encounter: Secondary | ICD-10-CM | POA: Diagnosis present

## 2021-09-20 DIAGNOSIS — S2243XA Multiple fractures of ribs, bilateral, initial encounter for closed fracture: Secondary | ICD-10-CM | POA: Diagnosis present

## 2021-09-20 DIAGNOSIS — S21102A Unspecified open wound of left front wall of thorax without penetration into thoracic cavity, initial encounter: Secondary | ICD-10-CM | POA: Diagnosis not present

## 2021-09-20 DIAGNOSIS — E875 Hyperkalemia: Secondary | ICD-10-CM | POA: Diagnosis not present

## 2021-09-20 DIAGNOSIS — S36428A Contusion of other part of small intestine, initial encounter: Secondary | ICD-10-CM | POA: Diagnosis present

## 2021-09-20 DIAGNOSIS — S3632XA Contusion of stomach, initial encounter: Secondary | ICD-10-CM | POA: Diagnosis present

## 2021-09-20 DIAGNOSIS — K658 Other peritonitis: Secondary | ICD-10-CM | POA: Diagnosis present

## 2021-09-20 DIAGNOSIS — E870 Hyperosmolality and hypernatremia: Secondary | ICD-10-CM | POA: Diagnosis present

## 2021-09-20 DIAGNOSIS — S37052A Moderate laceration of left kidney, initial encounter: Secondary | ICD-10-CM | POA: Diagnosis present

## 2021-09-20 DIAGNOSIS — D649 Anemia, unspecified: Secondary | ICD-10-CM | POA: Diagnosis not present

## 2021-09-20 DIAGNOSIS — S22089B Unspecified fracture of T11-T12 vertebra, initial encounter for open fracture: Secondary | ICD-10-CM | POA: Diagnosis present

## 2021-09-20 DIAGNOSIS — Z66 Do not resuscitate: Secondary | ICD-10-CM | POA: Diagnosis not present

## 2021-09-20 DIAGNOSIS — J9601 Acute respiratory failure with hypoxia: Secondary | ICD-10-CM | POA: Diagnosis present

## 2021-09-20 DIAGNOSIS — S271XXA Traumatic hemothorax, initial encounter: Secondary | ICD-10-CM | POA: Diagnosis present

## 2021-09-20 DIAGNOSIS — I959 Hypotension, unspecified: Secondary | ICD-10-CM | POA: Diagnosis present

## 2021-09-20 DIAGNOSIS — S82202A Unspecified fracture of shaft of left tibia, initial encounter for closed fracture: Secondary | ICD-10-CM | POA: Diagnosis present

## 2021-09-20 DIAGNOSIS — B9561 Methicillin susceptible Staphylococcus aureus infection as the cause of diseases classified elsewhere: Secondary | ICD-10-CM | POA: Diagnosis not present

## 2021-09-20 DIAGNOSIS — J156 Pneumonia due to other aerobic Gram-negative bacteria: Secondary | ICD-10-CM | POA: Diagnosis not present

## 2021-09-20 DIAGNOSIS — S82202B Unspecified fracture of shaft of left tibia, initial encounter for open fracture type I or II: Secondary | ICD-10-CM | POA: Diagnosis present

## 2021-09-20 DIAGNOSIS — S3630XA Unspecified injury of stomach, initial encounter: Secondary | ICD-10-CM | POA: Diagnosis not present

## 2021-09-20 DIAGNOSIS — S36118A Other injury of liver, initial encounter: Secondary | ICD-10-CM | POA: Diagnosis not present

## 2021-09-20 DIAGNOSIS — N179 Acute kidney failure, unspecified: Secondary | ICD-10-CM | POA: Diagnosis present

## 2021-09-20 HISTORY — PX: CHEST TUBE INSERTION: SHX231

## 2021-09-20 HISTORY — PX: SPLENECTOMY, TOTAL: SHX788

## 2021-09-20 HISTORY — PX: LAPAROTOMY: SHX154

## 2021-09-20 LAB — POCT I-STAT 7, (LYTES, BLD GAS, ICA,H+H)
Acid-Base Excess: 1 mmol/L (ref 0.0–2.0)
Acid-Base Excess: 1 mmol/L (ref 0.0–2.0)
Bicarbonate: 30.5 mmol/L — ABNORMAL HIGH (ref 20.0–28.0)
Bicarbonate: 25 mmol/L (ref 20.0–28.0)
Calcium, Ion: 0.9 mmol/L — ABNORMAL LOW (ref 1.15–1.40)
Calcium, Ion: 1.07 mmol/L — ABNORMAL LOW (ref 1.15–1.40)
HCT: 33 % — ABNORMAL LOW (ref 39.0–52.0)
HCT: 36 % — ABNORMAL LOW (ref 39.0–52.0)
Hemoglobin: 11.2 g/dL — ABNORMAL LOW (ref 13.0–17.0)
Hemoglobin: 12.2 g/dL — ABNORMAL LOW (ref 13.0–17.0)
O2 Saturation: 81 %
O2 Saturation: 99 %
Patient temperature: 34
Patient temperature: 34.5
Potassium: 4.6 mmol/L (ref 3.5–5.1)
Potassium: 3.1 mmol/L — ABNORMAL LOW (ref 3.5–5.1)
Sodium: 145 mmol/L (ref 135–145)
Sodium: 140 mmol/L (ref 135–145)
TCO2: 33 mmol/L — ABNORMAL HIGH (ref 22–32)
TCO2: 26 mmol/L (ref 22–32)
pCO2 arterial: 65.3 mmHg (ref 32–48)
pCO2 arterial: 34.6 mmHg (ref 32–48)
pH, Arterial: 7.261 — ABNORMAL LOW (ref 7.35–7.45)
pH, Arterial: 7.457 — ABNORMAL HIGH (ref 7.35–7.45)
pO2, Arterial: 46 mmHg — ABNORMAL LOW (ref 83–108)
pO2, Arterial: 122 mmHg — ABNORMAL HIGH (ref 83–108)

## 2021-09-20 LAB — POCT I-STAT, CHEM 8
BUN: 14 mg/dL (ref 6–20)
Calcium, Ion: 0.77 mmol/L — CL (ref 1.15–1.40)
Chloride: 104 mmol/L (ref 98–111)
Creatinine, Ser: 1 mg/dL (ref 0.61–1.24)
Glucose, Bld: 275 mg/dL — ABNORMAL HIGH (ref 70–99)
HCT: 31 % — ABNORMAL LOW (ref 39.0–52.0)
Hemoglobin: 10.5 g/dL — ABNORMAL LOW (ref 13.0–17.0)
Potassium: 3.8 mmol/L (ref 3.5–5.1)
Sodium: 135 mmol/L (ref 135–145)
TCO2: 16 mmol/L — ABNORMAL LOW (ref 22–32)

## 2021-09-20 LAB — BPAM PLATELET PHERESIS
Blood Product Expiration Date: 202305212359
ISSUE DATE / TIME: 202305190548
Unit Type and Rh: 6200

## 2021-09-20 LAB — PREPARE PLATELET PHERESIS: Unit division: 0

## 2021-09-20 LAB — I-STAT CHEM 8, ED
BUN: 14 mg/dL (ref 8–23)
Calcium, Ion: 0.77 mmol/L — CL (ref 1.15–1.40)
Chloride: 104 mmol/L (ref 98–111)
Creatinine, Ser: 1 mg/dL (ref 0.61–1.24)
Glucose, Bld: 275 mg/dL — ABNORMAL HIGH (ref 70–99)
HCT: 31 % — ABNORMAL LOW (ref 39.0–52.0)
Hemoglobin: 10.5 g/dL — ABNORMAL LOW (ref 13.0–17.0)
Potassium: 3.8 mmol/L (ref 3.5–5.1)
Sodium: 135 mmol/L (ref 135–145)
TCO2: 16 mmol/L — ABNORMAL LOW (ref 22–32)

## 2021-09-20 LAB — MRSA NEXT GEN BY PCR, NASAL: MRSA by PCR Next Gen: NOT DETECTED

## 2021-09-20 LAB — PREPARE CRYOPRECIPITATE: Unit division: 0

## 2021-09-20 LAB — BPAM CRYOPRECIPITATE
Blood Product Expiration Date: 202305191059
ISSUE DATE / TIME: 202305190548
Unit Type and Rh: 6200

## 2021-09-20 LAB — CBC
HCT: 34.2 % — ABNORMAL LOW (ref 39.0–52.0)
Hemoglobin: 12.2 g/dL — ABNORMAL LOW (ref 13.0–17.0)
MCH: 30.4 pg (ref 26.0–34.0)
MCHC: 35.7 g/dL (ref 30.0–36.0)
MCV: 85.3 fL (ref 80.0–100.0)
Platelets: 100 10*3/uL — ABNORMAL LOW (ref 150–400)
RBC: 4.01 MIL/uL — ABNORMAL LOW (ref 4.22–5.81)
RDW: 15.6 % — ABNORMAL HIGH (ref 11.5–15.5)
WBC: 9.1 10*3/uL (ref 4.0–10.5)
nRBC: 0 % (ref 0.0–0.2)

## 2021-09-20 LAB — ABO/RH: ABO/RH(D): B POS

## 2021-09-20 LAB — BLOOD PRODUCT ORDER (VERBAL) VERIFICATION

## 2021-09-20 SURGERY — CHEST TUBE INSERTION
Anesthesia: General

## 2021-09-20 MED ORDER — ALBUMIN HUMAN 5 % IV SOLN
25.0000 g | Freq: Once | INTRAVENOUS | Status: AC
  Administered 2021-09-20: 25 g via INTRAVENOUS
  Filled 2021-09-20: qty 500

## 2021-09-20 MED ORDER — FENTANYL CITRATE (PF) 250 MCG/5ML IJ SOLN
INTRAMUSCULAR | Status: AC
  Filled 2021-09-20: qty 5

## 2021-09-20 MED ORDER — SODIUM BICARBONATE 8.4 % IV SOLN
INTRAVENOUS | Status: DC | PRN
  Administered 2021-09-20: 50 mL via INTRAVENOUS

## 2021-09-20 MED ORDER — ONDANSETRON HCL 4 MG/2ML IJ SOLN
4.0000 mg | Freq: Four times a day (QID) | INTRAMUSCULAR | Status: DC | PRN
Start: 2021-09-20 — End: 2021-10-02

## 2021-09-20 MED ORDER — MIDAZOLAM HCL 2 MG/2ML IJ SOLN
INTRAMUSCULAR | Status: AC
  Filled 2021-09-20: qty 2

## 2021-09-20 MED ORDER — PROPOFOL 1000 MG/100ML IV EMUL
INTRAVENOUS | Status: AC
  Administered 2021-09-20: 40 ug/kg/min via INTRAVENOUS
  Filled 2021-09-20: qty 100

## 2021-09-20 MED ORDER — VASOPRESSIN 20 UNIT/ML IV SOLN
INTRAVENOUS | Status: DC | PRN
  Administered 2021-09-20 (×2): 3 [IU] via INTRAVENOUS
  Administered 2021-09-20: 2 [IU] via INTRAVENOUS
  Administered 2021-09-20: 5 [IU] via INTRAVENOUS
  Administered 2021-09-20: 2 [IU] via INTRAVENOUS

## 2021-09-20 MED ORDER — CHLORHEXIDINE GLUCONATE 0.12% ORAL RINSE (MEDLINE KIT)
15.0000 mL | Freq: Two times a day (BID) | OROMUCOSAL | Status: DC
  Administered 2021-09-20 – 2021-10-02 (×24): 15 mL via OROMUCOSAL

## 2021-09-20 MED ORDER — PROPOFOL 1000 MG/100ML IV EMUL
0.0000 ug/kg/min | INTRAVENOUS | Status: DC
  Administered 2021-09-20 (×2): 40 ug/kg/min via INTRAVENOUS
  Administered 2021-09-20: 20 ug/kg/min via INTRAVENOUS
  Administered 2021-09-21 (×2): 40 ug/kg/min via INTRAVENOUS
  Administered 2021-09-21 – 2021-09-22 (×5): 20 ug/kg/min via INTRAVENOUS
  Administered 2021-09-23: 30 ug/kg/min via INTRAVENOUS
  Administered 2021-09-23: 20 ug/kg/min via INTRAVENOUS
  Administered 2021-09-23: 50 ug/kg/min via INTRAVENOUS
  Administered 2021-09-23: 20 ug/kg/min via INTRAVENOUS
  Administered 2021-09-24: 30 ug/kg/min via INTRAVENOUS
  Administered 2021-09-24: 35 ug/kg/min via INTRAVENOUS
  Administered 2021-09-24: 30 ug/kg/min via INTRAVENOUS
  Administered 2021-09-24 – 2021-09-25 (×3): 35 ug/kg/min via INTRAVENOUS
  Administered 2021-09-25: 30 ug/kg/min via INTRAVENOUS
  Filled 2021-09-20 (×2): qty 100
  Filled 2021-09-20: qty 200
  Filled 2021-09-20 (×13): qty 100
  Filled 2021-09-20: qty 200
  Filled 2021-09-20 (×4): qty 100

## 2021-09-20 MED ORDER — ALBUTEROL SULFATE HFA 108 (90 BASE) MCG/ACT IN AERS
INHALATION_SPRAY | RESPIRATORY_TRACT | Status: DC | PRN
  Administered 2021-09-20: 4 via RESPIRATORY_TRACT

## 2021-09-20 MED ORDER — PHENYLEPHRINE HCL (PRESSORS) 10 MG/ML IV SOLN
INTRAVENOUS | Status: DC | PRN
  Administered 2021-09-20: 160 ug via INTRAVENOUS

## 2021-09-20 MED ORDER — SODIUM CHLORIDE 0.9 % IV SOLN: INTRAVENOUS | Status: DC | PRN

## 2021-09-20 MED ORDER — NOREPINEPHRINE 4 MG/250ML-% IV SOLN
INTRAVENOUS | Status: AC
  Administered 2021-09-20: 10 ug/min via INTRAVENOUS
  Filled 2021-09-20: qty 250

## 2021-09-20 MED ORDER — SODIUM CHLORIDE 0.9 % IV SOLN
INTRAVENOUS | Status: DC
  Administered 2021-09-20: 950 mL via INTRAVENOUS

## 2021-09-20 MED ORDER — ROCURONIUM BROMIDE 100 MG/10ML IV SOLN
INTRAVENOUS | Status: DC | PRN
  Administered 2021-09-20: 100 mg via INTRAVENOUS

## 2021-09-20 MED ORDER — VASOPRESSIN 20 UNIT/ML IV SOLN
INTRAVENOUS | Status: AC
  Filled 2021-09-20: qty 1

## 2021-09-20 MED ORDER — MORPHINE SULFATE (PF) 2 MG/ML IV SOLN
2.0000 mg | INTRAVENOUS | Status: DC | PRN
  Administered 2021-09-23: 2 mg via INTRAVENOUS
  Administered 2021-09-29 – 2021-10-01 (×5): 4 mg via INTRAVENOUS
  Filled 2021-09-20 (×4): qty 2
  Filled 2021-09-20: qty 1
  Filled 2021-09-20: qty 2

## 2021-09-20 MED ORDER — CEFAZOLIN SODIUM-DEXTROSE 2-3 GM-%(50ML) IV SOLR
INTRAVENOUS | Status: DC | PRN
  Administered 2021-09-20: 2 g via INTRAVENOUS

## 2021-09-20 MED ORDER — ONDANSETRON 4 MG PO TBDP: 4.0000 mg | ORAL_TABLET | Freq: Four times a day (QID) | ORAL | Status: DC | PRN

## 2021-09-20 MED ORDER — ORAL CARE MOUTH RINSE
15.0000 mL | OROMUCOSAL | Status: DC
  Administered 2021-09-20 – 2021-10-02 (×113): 15 mL via OROMUCOSAL

## 2021-09-20 MED ORDER — MIDAZOLAM HCL 5 MG/5ML IJ SOLN
INTRAMUSCULAR | Status: DC | PRN
Start: 2021-09-20 — End: 2021-09-20
  Administered 2021-09-20: 4 mg via INTRAVENOUS

## 2021-09-20 MED ORDER — NOREPINEPHRINE 4 MG/250ML-% IV SOLN
0.0000 ug/min | INTRAVENOUS | Status: DC
  Administered 2021-09-20: 4 ug/min via INTRAVENOUS
  Administered 2021-09-20: 12 ug/min via INTRAVENOUS
  Administered 2021-09-21 (×2): 16 ug/min via INTRAVENOUS
  Administered 2021-09-21: 12 ug/min via INTRAVENOUS
  Administered 2021-09-23: 2 ug/min via INTRAVENOUS
  Administered 2021-09-24: 10 ug/min via INTRAVENOUS
  Administered 2021-09-24: 16 ug/min via INTRAVENOUS
  Administered 2021-09-24: 12 ug/min via INTRAVENOUS
  Administered 2021-09-24: 18 ug/min via INTRAVENOUS
  Administered 2021-09-25: 6 ug/min via INTRAVENOUS
  Administered 2021-09-25: 4 ug/min via INTRAVENOUS
  Administered 2021-09-26: 6 ug/min via INTRAVENOUS
  Filled 2021-09-20 (×12): qty 250

## 2021-09-20 MED ORDER — PHENYLEPHRINE HCL-NACL 20-0.9 MG/250ML-% IV SOLN
INTRAVENOUS | Status: DC | PRN
  Administered 2021-09-20: 40 ug/min via INTRAVENOUS

## 2021-09-20 MED ORDER — FENTANYL 2500MCG IN NS 250ML (10MCG/ML) PREMIX INFUSION
50.0000 ug/h | INTRAVENOUS | Status: DC
  Administered 2021-09-20: 50 ug/h via INTRAVENOUS
  Administered 2021-09-21: 100 ug/h via INTRAVENOUS
  Administered 2021-09-22: 75 ug/h via INTRAVENOUS
  Administered 2021-09-23: 100 ug/h via INTRAVENOUS
  Administered 2021-09-23 – 2021-09-24 (×2): 125 ug/h via INTRAVENOUS
  Administered 2021-09-25 (×3): 150 ug/h via INTRAVENOUS
  Administered 2021-09-26 (×2): 200 ug/h via INTRAVENOUS
  Administered 2021-09-27 – 2021-09-28 (×2): 150 ug/h via INTRAVENOUS
  Filled 2021-09-20 (×12): qty 250

## 2021-09-20 MED ORDER — 0.9 % SODIUM CHLORIDE (POUR BTL) OPTIME
TOPICAL | Status: DC | PRN
  Administered 2021-09-20: 3000 mL

## 2021-09-20 MED ORDER — ALBUMIN HUMAN 5 % IV SOLN
25.0000 g | Freq: Once | INTRAVENOUS | Status: AC
Start: 2021-09-20 — End: 2021-09-20
  Administered 2021-09-20: 25 g via INTRAVENOUS
  Filled 2021-09-20: qty 500

## 2021-09-20 MED ORDER — CALCIUM CHLORIDE 10 % IV SOLN
INTRAVENOUS | Status: DC | PRN
  Administered 2021-09-20: 1 g via INTRAVENOUS

## 2021-09-20 MED ORDER — CHLORHEXIDINE GLUCONATE CLOTH 2 % EX PADS
6.0000 | MEDICATED_PAD | Freq: Every day | CUTANEOUS | Status: DC
  Administered 2021-09-20 – 2021-10-01 (×13): 6 via TOPICAL

## 2021-09-20 MED ORDER — DOCUSATE SODIUM 50 MG/5ML PO LIQD
100.0000 mg | Freq: Two times a day (BID) | ORAL | Status: DC
  Administered 2021-09-20 – 2021-09-24 (×9): 100 mg
  Filled 2021-09-20 (×9): qty 10

## 2021-09-20 MED ORDER — FENTANYL BOLUS VIA INFUSION
50.0000 ug | INTRAVENOUS | Status: DC | PRN
  Administered 2021-09-22 (×5): 50 ug via INTRAVENOUS
  Administered 2021-09-23 (×5): 100 ug via INTRAVENOUS
  Administered 2021-09-26 (×3): 50 ug via INTRAVENOUS
  Administered 2021-09-27 (×2): 100 ug via INTRAVENOUS
  Administered 2021-09-27 (×2): 50 ug via INTRAVENOUS
  Administered 2021-09-27 – 2021-09-28 (×2): 100 ug via INTRAVENOUS
  Administered 2021-09-28 (×3): 50 ug via INTRAVENOUS
  Administered 2021-09-28: 100 ug via INTRAVENOUS
  Administered 2021-09-28: 50 ug via INTRAVENOUS
  Administered 2021-09-28 – 2021-10-02 (×13): 100 ug via INTRAVENOUS

## 2021-09-20 MED ORDER — ACETAMINOPHEN 160 MG/5ML PO SOLN
650.0000 mg | ORAL | Status: DC | PRN
  Administered 2021-09-21 – 2021-09-23 (×6): 650 mg
  Filled 2021-09-20 (×6): qty 20.3

## 2021-09-20 MED ORDER — LACTATED RINGERS IV SOLN: INTRAVENOUS | Status: DC | PRN

## 2021-09-20 MED ORDER — IOHEXOL 300 MG/ML  SOLN
100.0000 mL | Freq: Once | INTRAMUSCULAR | Status: AC | PRN
  Administered 2021-09-20: 100 mL via INTRAVENOUS

## 2021-09-20 MED ORDER — PANTOPRAZOLE SODIUM 40 MG IV SOLR
40.0000 mg | Freq: Every day | INTRAVENOUS | Status: DC
  Administered 2021-09-21 – 2021-09-22 (×2): 40 mg via INTRAVENOUS
  Filled 2021-09-20 (×2): qty 10

## 2021-09-20 MED ORDER — FENTANYL CITRATE PF 50 MCG/ML IJ SOSY: 50.0000 ug | PREFILLED_SYRINGE | Freq: Once | INTRAMUSCULAR | Status: DC

## 2021-09-20 SURGICAL SUPPLY — 50 items
APL PRP STRL LF DISP 70% ISPRP (MISCELLANEOUS) ×1
BAG COUNTER SPONGE SURGICOUNT (BAG) ×2 IMPLANT
BAG SPNG CNTER NS LX DISP (BAG) ×1
BNDG ELASTIC 4X5.8 VLCR STR LF (GAUZE/BANDAGES/DRESSINGS) ×1 IMPLANT
BNDG ELASTIC 6X5.8 VLCR STR LF (GAUZE/BANDAGES/DRESSINGS) ×1 IMPLANT
BNDG GAUZE ELAST 4 BULKY (GAUZE/BANDAGES/DRESSINGS) ×2 IMPLANT
CHLORAPREP W/TINT 26 (MISCELLANEOUS) ×2 IMPLANT
COVER SURGICAL LIGHT HANDLE (MISCELLANEOUS) ×2 IMPLANT
DRAPE LAPAROSCOPIC ABDOMINAL (DRAPES) ×2 IMPLANT
DRAPE WARM FLUID 44X44 (DRAPES) ×2 IMPLANT
DRSG OPSITE 11X17.75 LRG (GAUZE/BANDAGES/DRESSINGS) ×1 IMPLANT
DRSG OPSITE POSTOP 4X10 (GAUZE/BANDAGES/DRESSINGS) ×1 IMPLANT
DRSG OPSITE POSTOP 4X8 (GAUZE/BANDAGES/DRESSINGS) ×1 IMPLANT
ELECT BLADE 6.5 EXT (BLADE) ×1 IMPLANT
ELECT CAUTERY BLADE 6.4 (BLADE) ×2 IMPLANT
ELECT REM PT RETURN 9FT ADLT (ELECTROSURGICAL) ×2
ELECTRODE REM PT RTRN 9FT ADLT (ELECTROSURGICAL) ×1 IMPLANT
GAUZE PAD ABD 8X10 STRL (GAUZE/BANDAGES/DRESSINGS) ×1 IMPLANT
GAUZE SPONGE 4X4 12PLY STRL (GAUZE/BANDAGES/DRESSINGS) ×2 IMPLANT
GAUZE SPONGE 4X4 12PLY STRL LF (GAUZE/BANDAGES/DRESSINGS) ×1 IMPLANT
GLOVE BIO SURGEON STRL SZ7 (GLOVE) ×1 IMPLANT
GLOVE BIOGEL M STRL SZ7.5 (GLOVE) ×1 IMPLANT
GLOVE BIOGEL PI IND STRL 7.0 (GLOVE) ×1 IMPLANT
GLOVE BIOGEL PI INDICATOR 7.0 (GLOVE) ×1
GLOVE INDICATOR 7.0 STRL GRN (GLOVE) ×2 IMPLANT
GLOVE INDICATOR 7.5 STRL GRN (GLOVE) ×1 IMPLANT
GLOVE SURG SS PI 7.0 STRL IVOR (GLOVE) ×2 IMPLANT
GOWN STRL REUS W/ TWL LRG LVL3 (GOWN DISPOSABLE) ×2 IMPLANT
GOWN STRL REUS W/TWL LRG LVL3 (GOWN DISPOSABLE) ×4
HANDLE SUCTION POOLE (INSTRUMENTS) ×1 IMPLANT
KIT BASIN OR (CUSTOM PROCEDURE TRAY) ×2 IMPLANT
LIGASURE IMPACT 36 18CM CVD LR (INSTRUMENTS) ×1 IMPLANT
NS IRRIG 1000ML POUR BTL (IV SOLUTION) ×3 IMPLANT
PACK GENERAL/GYN (CUSTOM PROCEDURE TRAY) ×2 IMPLANT
PAD ABD 8X10 STRL (GAUZE/BANDAGES/DRESSINGS) ×1 IMPLANT
PENCIL SMOKE EVACUATOR (MISCELLANEOUS) ×2 IMPLANT
SPONGE T-LAP 18X18 ~~LOC~~+RFID (SPONGE) ×2 IMPLANT
STAPLER VISISTAT 35W (STAPLE) ×2 IMPLANT
SUCTION POOLE HANDLE (INSTRUMENTS) ×2
SUT PDS AB 0 CTX 36 PDP370T (SUTURE) ×2 IMPLANT
SUT PDS AB 3-0 SH 27 (SUTURE) ×4 IMPLANT
SUT SILK 2 0 SH CR/8 (SUTURE) ×2 IMPLANT
SUT SILK 2 0 TIES 10X30 (SUTURE) ×2 IMPLANT
SUT SILK 3 0 SH CR/8 (SUTURE) ×2 IMPLANT
SUT SILK 3 0 TIES 10X30 (SUTURE) ×2 IMPLANT
TOWEL GREEN STERILE (TOWEL DISPOSABLE) ×2 IMPLANT
TRAY CHEST TUBE INSERTION DISP (SET/KITS/TRAYS/PACK) ×1 IMPLANT
TRAY FOLEY MTR SLVR 16FR STAT (SET/KITS/TRAYS/PACK) ×2 IMPLANT
TUBE CONNECTING 20X1/4 (TUBING) ×1 IMPLANT
YANKAUER SUCT BULB TIP NO VENT (SUCTIONS) ×1 IMPLANT

## 2021-09-20 NOTE — Progress Notes (Signed)
Pt transported from [facility] to OR room #1 with no complications.
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
## 2021-09-20 NOTE — Anesthesia Procedure Notes (Addendum)
Central Venous Catheter Insertion Performed by: Leonides Grills, MD, anesthesiologist Start/End05/02/2022 5:35 AM, 09/13/2021 5:45 AM Patient location: OR. Emergency situation Position: supine Patient sedated Hand hygiene performed , maximum sterile barriers used  and Seldinger technique used Catheter size: 8.5 Fr Total catheter length 10. Sheath introducer Procedure performed using ultrasound guided technique. Ultrasound Notes:anatomy identified, needle tip was noted to be adjacent to the nerve/plexus identified, no ultrasound evidence of intravascular and/or intraneural injection and image(s) printed for medical record Attempts: 1 Following insertion, line sutured, dressing applied and Biopatch. Post procedure assessment: blood return through all ports, free fluid flow and no air  Patient tolerated the procedure well with no immediate complications. Additional procedure comments: Multi lumen catheter placed.
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
## 2021-09-20 NOTE — Anesthesia Postprocedure Evaluation (Signed)
Anesthesia Post Note  Patient: Ethan Moore  Procedure(s) Performed: EXPLORATORY LAPAROTOMY SPLENECTOMY CHEST TUBE INSERTION     Patient location during evaluation: SICU Anesthesia Type: General Level of consciousness: sedated Pain management: pain level controlled Vital Signs Assessment: post-procedure vital signs reviewed and stable Respiratory status: patient remains intubated per anesthesia plan Cardiovascular status: gaurded. Postop Assessment: no apparent nausea or vomiting Anesthetic complications: no   No notable events documented.  Last Vitals:  Vitals:   09/29/2021 0437 09/07/2021 0437  BP: (!) 50/0 (!) 50/0  Pulse: 74 78  Resp: (!) 30 (!) 29  Temp: (!) 31.7 C (!) 31.8 C  SpO2: 100% 100%    Last Pain:  Vitals:   09/22/2021 0437  TempSrc: Temporal                 Ethan Moore

## 2021-09-20 NOTE — TOC CAGE-AID Note (Signed)
Transition of Care [facility]) - CAGE-AID Screening   Patient Details  Name: Ethan Moore MRN: [id number] Date of Birth: [birth year]  Transition of Care [facility]) CM/SW Contact:    Perl Folmar C Tarpley-Carter, [place] Phone Number: 09/25/2021, 10:53 AM   Clinical Narrative: Pt is unable to participate in Cage Aid. Pt is currently intubated and sedated.  Merit Maybee Tarpley-Carter, MSW, LCSW-A Pronouns:  She/Her/Hers [facility] Clinical Social Worker Direct Number:  [phone] [email or web address]  CAGE-AID Screening: Substance Abuse Screening unable to be completed due to: : Patient unable to participate
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
## 2021-09-20 NOTE — Progress Notes (Signed)
Pt was transported to Ct scan and back without complications.
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
## 2021-09-20 NOTE — Anesthesia Procedure Notes (Signed)
Arterial Line Insertion Start/End05/26/2023 5:25 AM, 09/10/2021 5:30 AM Performed by: Leonides Grills, MD, anesthesiologist  Patient location: OR. Emergency situation Patient sedated Left, radial was placed Catheter size: 20 G Hand hygiene performed , maximum sterile barriers used  and Seldinger technique used  Attempts: 1 Procedure performed using ultrasound guided technique. Ultrasound Notes:anatomy identified, needle tip was noted to be adjacent to the nerve/plexus identified and no ultrasound evidence of intravascular and/or intraneural injection Following insertion, dressing applied and Biopatch. Post procedure assessment: normal and unchanged  Patient tolerated the procedure well with no immediate complications.
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
## 2021-09-20 NOTE — Transfer of Care (Signed)
Immediate Anesthesia Transfer of Care Note  Patient: Ethan Moore  Procedure(s) Performed: EXPLORATORY LAPAROTOMY SPLENECTOMY CHEST TUBE INSERTION  Patient Location: SICU  Anesthesia Type:General  Level of Consciousness: sedated and Patient remains intubated per anesthesia plan  Airway & Oxygen Therapy: Patient remains intubated per anesthesia plan and Patient placed on Ventilator (see vital sign flow sheet for setting)  Post-op Assessment: Report given to RN and Post -op Vital signs reviewed and stable  Post vital signs: Reviewed and stable  Last Vitals:  Vitals Value Taken Time  BP    Temp    Pulse 103 09/19/2021 0658  Resp 18 10/01/2021 0658  SpO2 93 % 09/13/2021 0658  Vitals shown include unvalidated device data.  Last Pain:  Vitals:   09/22/2021 0437  TempSrc: Temporal         Complications: No notable events documented.
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
## 2021-09-21 ENCOUNTER — Inpatient Hospital Stay (HOSPITAL_COMMUNITY): Payer: Non-veteran care

## 2021-09-21 DIAGNOSIS — E440 Moderate protein-calorie malnutrition: Secondary | ICD-10-CM | POA: Insufficient documentation

## 2021-09-21 LAB — GLUCOSE, CAPILLARY
Glucose-Capillary: 124 mg/dL — ABNORMAL HIGH (ref 70–99)
Glucose-Capillary: 122 mg/dL — ABNORMAL HIGH (ref 70–99)
Glucose-Capillary: 134 mg/dL — ABNORMAL HIGH (ref 70–99)

## 2021-09-21 LAB — TRIGLYCERIDES: Triglycerides: 97 mg/dL (ref <150)

## 2021-09-21 MED ORDER — INSULIN ASPART 100 UNIT/ML IJ SOLN
0.0000 [IU] | Freq: Four times a day (QID) | INTRAMUSCULAR | Status: DC
  Administered 2021-09-21 – 2021-09-22 (×3): 1 [IU] via SUBCUTANEOUS
  Administered 2021-09-22 (×2): 2 [IU] via SUBCUTANEOUS
  Administered 2021-09-22 – 2021-09-23 (×2): 1 [IU] via SUBCUTANEOUS

## 2021-09-21 MED ORDER — THIAMINE HCL 100 MG PO TABS
100.0000 mg | ORAL_TABLET | Freq: Every day | ORAL | Status: DC
  Administered 2021-09-21 – 2021-09-25 (×5): 100 mg
  Filled 2021-09-21 (×5): qty 1

## 2021-09-21 MED ORDER — PIVOT 1.5 CAL PO LIQD
1000.0000 mL | ORAL | Status: DC
  Administered 2021-09-21 – 2021-09-25 (×6): 1000 mL
  Filled 2021-09-21: qty 1000
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
## 2021-09-22 ENCOUNTER — Encounter (HOSPITAL_COMMUNITY): Payer: Self-pay | Admitting: General Surgery

## 2021-09-22 ENCOUNTER — Inpatient Hospital Stay (HOSPITAL_COMMUNITY): Payer: Non-veteran care

## 2021-09-22 LAB — POCT I-STAT 7, (LYTES, BLD GAS, ICA,H+H)
Acid-base deficit: 1 mmol/L (ref 0.0–2.0)
Acid-base deficit: 2 mmol/L (ref 0.0–2.0)
Bicarbonate: 23.6 mmol/L (ref 20.0–28.0)
Bicarbonate: 22.9 mmol/L (ref 20.0–28.0)
Calcium, Ion: 1.14 mmol/L — ABNORMAL LOW (ref 1.15–1.40)
Calcium, Ion: 1.16 mmol/L (ref 1.15–1.40)
HCT: 30 % — ABNORMAL LOW (ref 39.0–52.0)
HCT: 27 % — ABNORMAL LOW (ref 39.0–52.0)
Hemoglobin: 10.2 g/dL — ABNORMAL LOW (ref 13.0–17.0)
Hemoglobin: 9.2 g/dL — ABNORMAL LOW (ref 13.0–17.0)
O2 Saturation: 87 %
O2 Saturation: 90 %
Patient temperature: 37.7
Potassium: 3.6 mmol/L (ref 3.5–5.1)
Potassium: 3.7 mmol/L (ref 3.5–5.1)
Sodium: 138 mmol/L (ref 135–145)
Sodium: 138 mmol/L (ref 135–145)
TCO2: 25 mmol/L (ref 22–32)
TCO2: 24 mmol/L (ref 22–32)
pCO2 arterial: 40.9 mmHg (ref 32–48)
pCO2 arterial: 38.2 mmHg (ref 32–48)
pH, Arterial: 7.373 (ref 7.35–7.45)
pH, Arterial: 7.385 (ref 7.35–7.45)
pO2, Arterial: 56 mmHg — ABNORMAL LOW (ref 83–108)
pO2, Arterial: 60 mmHg — ABNORMAL LOW (ref 83–108)

## 2021-09-22 LAB — BPAM FFP
Blood Product Expiration Date: 202305212359
Blood Product Expiration Date: 202305212359
Blood Product Expiration Date: 202305212359
Blood Product Expiration Date: 202305212359
Blood Product Expiration Date: 202305222359
Blood Product Expiration Date: 202305222359
Blood Product Expiration Date: 202305242359
Blood Product Expiration Date: 202305242359
Blood Product Expiration Date: 202305242359
Blood Product Expiration Date: 202305242359
Blood Product Expiration Date: 202305262359
Blood Product Expiration Date: 202305312359
Blood Product Expiration Date: 202306012359
Blood Product Expiration Date: 202306052359
ISSUE DATE / TIME: 202305190449
ISSUE DATE / TIME: 202305190449
ISSUE DATE / TIME: 202305190449
ISSUE DATE / TIME: 202305190449
ISSUE DATE / TIME: 202305190449
ISSUE DATE / TIME: 202305190449
ISSUE DATE / TIME: 202305190449
ISSUE DATE / TIME: 202305190454
ISSUE DATE / TIME: 202305190502
ISSUE DATE / TIME: 202305190502
Unit Type and Rh: 1700
Unit Type and Rh: 600
Unit Type and Rh: 6200
Unit Type and Rh: 6200
Unit Type and Rh: 6200
Unit Type and Rh: 6200
Unit Type and Rh: 6200
Unit Type and Rh: 6200
Unit Type and Rh: 6200
Unit Type and Rh: 6200
Unit Type and Rh: 6200
Unit Type and Rh: 7300
Unit Type and Rh: 7300
Unit Type and Rh: 7300

## 2021-09-22 LAB — GLUCOSE, CAPILLARY
Glucose-Capillary: 109 mg/dL — ABNORMAL HIGH (ref 70–99)
Glucose-Capillary: 130 mg/dL — ABNORMAL HIGH (ref 70–99)
Glucose-Capillary: 138 mg/dL — ABNORMAL HIGH (ref 70–99)
Glucose-Capillary: 153 mg/dL — ABNORMAL HIGH (ref 70–99)
Glucose-Capillary: 155 mg/dL — ABNORMAL HIGH (ref 70–99)

## 2021-09-22 LAB — PREPARE FRESH FROZEN PLASMA
Unit division: 0
Unit division: 0
Unit division: 0
Unit division: 0
Unit division: 0
Unit division: 0
Unit division: 0
Unit division: 0
Unit division: 0
Unit division: 0
Unit division: 0
Unit division: 0
Unit division: 0
Unit division: 0

## 2021-09-22 LAB — BASIC METABOLIC PANEL
Anion gap: 6 (ref 5–15)
BUN: 21 mg/dL — ABNORMAL HIGH (ref 6–20)
CO2: 23 mmol/L (ref 22–32)
Calcium: 7.5 mg/dL — ABNORMAL LOW (ref 8.9–10.3)
Chloride: 107 mmol/L (ref 98–111)
Creatinine, Ser: 1.68 mg/dL — ABNORMAL HIGH (ref 0.61–1.24)
GFR, Estimated: 47 mL/min — ABNORMAL LOW (ref >60)
Glucose, Bld: 139 mg/dL — ABNORMAL HIGH (ref 70–99)
Potassium: 3.5 mmol/L (ref 3.5–5.1)
Sodium: 136 mmol/L (ref 135–145)

## 2021-09-22 LAB — HEMOGLOBIN A1C
Hgb A1c MFr Bld: 4.8 % (ref 4.8–5.6)
Mean Plasma Glucose: 91.06 mg/dL

## 2021-09-22 MED ORDER — POLYETHYLENE GLYCOL 3350 17 G PO PACK
17.0000 g | PACK | Freq: Every day | ORAL | Status: DC
  Administered 2021-09-22 – 2021-09-24 (×3): 17 g
  Filled 2021-09-22 (×3): qty 1

## 2021-09-22 MED ORDER — PANTOPRAZOLE 2 MG/ML SUSPENSION
40.0000 mg | Freq: Every day | ORAL | Status: DC
  Administered 2021-09-22 – 2021-09-24 (×3): 40 mg
  Filled 2021-09-22 (×3): qty 20

## 2021-09-22 MED ORDER — LACTATED RINGERS IV BOLUS
1000.0000 mL | Freq: Once | INTRAVENOUS | Status: AC
  Administered 2021-09-22: 1000 mL via INTRAVENOUS
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
## 2021-09-22 NOTE — Progress Notes (Signed)
Patient ID: Ethan Moore, male   DOB: 20-Oct-1960, 61 y.o.   MRN: [id number] BP 125/81   Pulse (!) 109   Temp (!) 100.8 F (38.2 C)   Resp 18   Ht 6' (1.829 m)   Wt 98.6 kg   SpO2 94%   BMI 29.48 kg/m  Lethargic, will open eyes and follow commands. Not able to move his lower extremities. Will know more once he is more alert.

## 2021-09-22 NOTE — Progress Notes (Signed)
0835 RN walking by room and noticed O2 low 80s though not great waveform; pat difficult to suction, with a lot of secretions obtained. RT made aware and to bedside, lavaged and FiO2 increased to 50%. O2 sat increased to 87-89%; RT called again, fio2 increased to 60%, MD White made aware at this time. FiO2 then increased to 70% and adjustments made to peep and ABG obtained.

## 2021-09-23 ENCOUNTER — Inpatient Hospital Stay (HOSPITAL_COMMUNITY): Payer: Non-veteran care

## 2021-09-23 LAB — CBC WITH DIFFERENTIAL/PLATELET
Abs Immature Granulocytes: 0.06 10*3/uL (ref 0.00–0.07)
Basophils Absolute: 0 10*3/uL (ref 0.0–0.1)
Basophils Relative: 0 %
Eosinophils Absolute: 0 10*3/uL (ref 0.0–0.5)
Eosinophils Relative: 0 %
HCT: 29.5 % — ABNORMAL LOW (ref 39.0–52.0)
Hemoglobin: 10 g/dL — ABNORMAL LOW (ref 13.0–17.0)
Immature Granulocytes: 1 %
Lymphocytes Relative: 5 %
Lymphs Abs: 0.7 10*3/uL (ref 0.7–4.0)
MCH: 30.8 pg (ref 26.0–34.0)
MCHC: 33.9 g/dL (ref 30.0–36.0)
MCV: 90.8 fL (ref 80.0–100.0)
Monocytes Absolute: 1 10*3/uL (ref 0.1–1.0)
Monocytes Relative: 8 %
Neutro Abs: 11.5 10*3/uL — ABNORMAL HIGH (ref 1.7–7.7)
Neutrophils Relative %: 86 %
Platelets: 113 10*3/uL — ABNORMAL LOW (ref 150–400)
RBC: 3.25 MIL/uL — ABNORMAL LOW (ref 4.22–5.81)
RDW: 14.9 % (ref 11.5–15.5)
Smear Review: DECREASED
WBC: 13.3 10*3/uL — ABNORMAL HIGH (ref 4.0–10.5)
nRBC: 0.8 % — ABNORMAL HIGH (ref 0.0–0.2)

## 2021-09-23 LAB — POCT I-STAT 7, (LYTES, BLD GAS, ICA,H+H)
Acid-Base Excess: 0 mmol/L (ref 0.0–2.0)
Bicarbonate: 25.8 mmol/L (ref 20.0–28.0)
Calcium, Ion: 1.18 mmol/L (ref 1.15–1.40)
HCT: 37 % — ABNORMAL LOW (ref 39.0–52.0)
Hemoglobin: 12.6 g/dL — ABNORMAL LOW (ref 13.0–17.0)
O2 Saturation: 97 %
Potassium: 4 mmol/L (ref 3.5–5.1)
Sodium: 138 mmol/L (ref 135–145)
TCO2: 27 mmol/L (ref 22–32)
pCO2 arterial: 44.1 mmHg (ref 32–48)
pH, Arterial: 7.376 (ref 7.35–7.45)
pO2, Arterial: 94 mmHg (ref 83–108)

## 2021-09-23 LAB — GLUCOSE, CAPILLARY
Glucose-Capillary: 124 mg/dL — ABNORMAL HIGH (ref 70–99)
Glucose-Capillary: 112 mg/dL — ABNORMAL HIGH (ref 70–99)
Glucose-Capillary: 115 mg/dL — ABNORMAL HIGH (ref 70–99)
Glucose-Capillary: 111 mg/dL — ABNORMAL HIGH (ref 70–99)

## 2021-09-23 LAB — BASIC METABOLIC PANEL
Anion gap: 5 (ref 5–15)
BUN: 22 mg/dL — ABNORMAL HIGH (ref 6–20)
CO2: 23 mmol/L (ref 22–32)
Calcium: 7.8 mg/dL — ABNORMAL LOW (ref 8.9–10.3)
Chloride: 110 mmol/L (ref 98–111)
Creatinine, Ser: 1.66 mg/dL — ABNORMAL HIGH (ref 0.61–1.24)
GFR, Estimated: 47 mL/min — ABNORMAL LOW (ref >60)
Glucose, Bld: 150 mg/dL — ABNORMAL HIGH (ref 70–99)
Potassium: 4 mmol/L (ref 3.5–5.1)
Sodium: 138 mmol/L (ref 135–145)

## 2021-09-23 LAB — SURGICAL PATHOLOGY

## 2021-09-23 MED ORDER — SODIUM CHLORIDE 0.9 % IV SOLN
2.0000 g | Freq: Two times a day (BID) | INTRAVENOUS | Status: DC
  Administered 2021-09-23 – 2021-09-25 (×5): 2 g via INTRAVENOUS
  Filled 2021-09-23 (×5): qty 12.5

## 2021-09-23 MED ORDER — METOPROLOL TARTRATE 5 MG/5ML IV SOLN
2.5000 mg | Freq: Once | INTRAVENOUS | Status: AC
  Administered 2021-09-23: 2.5 mg via INTRAVENOUS
  Filled 2021-09-23: qty 5

## 2021-09-23 MED ORDER — ENOXAPARIN SODIUM 30 MG/0.3ML IJ SOSY: 30.0000 mg | PREFILLED_SYRINGE | INTRAMUSCULAR | Status: DC

## 2021-09-23 MED ORDER — ALBUMIN HUMAN 5 % IV SOLN
25.0000 g | Freq: Once | INTRAVENOUS | Status: AC
  Administered 2021-09-23: 25 g via INTRAVENOUS
  Filled 2021-09-23: qty 500

## 2021-09-23 MED ORDER — GUAIFENESIN 100 MG/5ML PO LIQD
15.0000 mL | Freq: Four times a day (QID) | ORAL | Status: DC
  Administered 2021-09-23 – 2021-09-25 (×10): 15 mL
  Filled 2021-09-23 (×10): qty 15

## 2021-09-23 MED ORDER — BISACODYL 10 MG RE SUPP
10.0000 mg | Freq: Every day | RECTAL | Status: DC | PRN
  Administered 2021-09-24: 10 mg via RECTAL
  Filled 2021-09-23: qty 1

## 2021-09-23 MED ORDER — OXYCODONE HCL 5 MG/5ML PO SOLN
5.0000 mg | Freq: Four times a day (QID) | ORAL | Status: DC | PRN
  Administered 2021-09-24 – 2021-09-25 (×3): 10 mg
  Filled 2021-09-23 (×3): qty 10

## 2021-09-23 MED ORDER — ENOXAPARIN SODIUM 30 MG/0.3ML IJ SOSY
30.0000 mg | PREFILLED_SYRINGE | INTRAMUSCULAR | Status: DC
  Administered 2021-09-23 – 2021-09-24 (×2): 30 mg via SUBCUTANEOUS
  Filled 2021-09-23 (×2): qty 0.3

## 2021-09-23 NOTE — Progress Notes (Signed)
Patient ID: Ethan Moore, male   DOB: 04-Feb-1961, 61 y.o.   MRN: [id number] BP (!) 134/101   Pulse (!) 132   Temp (!) 100.4 F (38 C)   Resp 16   Ht 6' (1.829 m)   Wt 98.6 kg   SpO2 96%   BMI 29.48 kg/m  No movement in the lower extremities Will still need an exam when not sedated. No new recommendations
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
## 2021-09-23 NOTE — Progress Notes (Signed)
On call MD made aware of pt's thick secretions and fever, CXR ordered
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
## 2021-09-23 NOTE — Progress Notes (Signed)
Patient will open eyes to voice but not withdrawal to pain in all extremities, per Dr. Bobbye Morton continue to decrease propofol and substitute with fentanyl or PRN oxycodone.

## 2021-09-23 NOTE — Progress Notes (Signed)
HR sustaining 129-130 despite pain meds, per Dr. Bedelia Person increase propofol now that neuro exam is at baseline.

## 2021-09-24 ENCOUNTER — Inpatient Hospital Stay (HOSPITAL_COMMUNITY): Payer: Non-veteran care

## 2021-09-24 LAB — BPAM RBC
Blood Product Expiration Date: 202305222359
Blood Product Expiration Date: 202305222359
Blood Product Expiration Date: 202305262359
Blood Product Expiration Date: 202305282359
Blood Product Expiration Date: 202306062359
Blood Product Expiration Date: 202306092359
Blood Product Expiration Date: 202306092359
Blood Product Expiration Date: 202306092359
Blood Product Expiration Date: 202306092359
Blood Product Expiration Date: 202306092359
Blood Product Expiration Date: 202306092359
Blood Product Expiration Date: 202306122359
Blood Product Expiration Date: 202306142359
Blood Product Expiration Date: 202306142359
Blood Product Expiration Date: 202306142359
Blood Product Expiration Date: 202306142359
Blood Product Expiration Date: 202306142359
Blood Product Expiration Date: 202306142359
Blood Product Expiration Date: 202306142359
ISSUE DATE / TIME: 202305190442
ISSUE DATE / TIME: 202305190445
ISSUE DATE / TIME: 202305190449
ISSUE DATE / TIME: 202305190449
ISSUE DATE / TIME: 202305190449
ISSUE DATE / TIME: 202305190449
ISSUE DATE / TIME: 202305190449
ISSUE DATE / TIME: 202305190449
ISSUE DATE / TIME: 202305190449
ISSUE DATE / TIME: 202305190449
ISSUE DATE / TIME: 202305190452
ISSUE DATE / TIME: 202305190500
ISSUE DATE / TIME: 202305190601
ISSUE DATE / TIME: 202305190601
ISSUE DATE / TIME: 202305190948
ISSUE DATE / TIME: 202305191058
ISSUE DATE / TIME: 202305191308
ISSUE DATE / TIME: 202305211010
ISSUE DATE / TIME: 202305221047
Unit Type and Rh: 5100
Unit Type and Rh: 5100
Unit Type and Rh: 5100
Unit Type and Rh: 5100
Unit Type and Rh: 5100
Unit Type and Rh: 5100
Unit Type and Rh: 5100
Unit Type and Rh: 5100
Unit Type and Rh: 5100
Unit Type and Rh: 5100
Unit Type and Rh: 5100
Unit Type and Rh: 5100
Unit Type and Rh: 5100
Unit Type and Rh: 5100
Unit Type and Rh: 5100
Unit Type and Rh: 7300
Unit Type and Rh: 7300
Unit Type and Rh: 7300
Unit Type and Rh: 7300

## 2021-09-24 LAB — TYPE AND SCREEN
ABO/RH(D): B POS
Antibody Screen: NEGATIVE
Unit division: 0
Unit division: 0
Unit division: 0
Unit division: 0
Unit division: 0
Unit division: 0
Unit division: 0
Unit division: 0
Unit division: 0
Unit division: 0
Unit division: 0
Unit division: 0
Unit division: 0
Unit division: 0
Unit division: 0
Unit division: 0
Unit division: 0
Unit division: 0
Unit division: 0

## 2021-09-24 LAB — POCT I-STAT 7, (LYTES, BLD GAS, ICA,H+H)
Acid-base deficit: 4 mmol/L — ABNORMAL HIGH (ref 0.0–2.0)
Bicarbonate: 21.9 mmol/L (ref 20.0–28.0)
Calcium, Ion: 1.16 mmol/L (ref 1.15–1.40)
HCT: 28 % — ABNORMAL LOW (ref 39.0–52.0)
Hemoglobin: 9.5 g/dL — ABNORMAL LOW (ref 13.0–17.0)
O2 Saturation: 95 %
Patient temperature: 102
Potassium: 4.5 mmol/L (ref 3.5–5.1)
Sodium: 139 mmol/L (ref 135–145)
TCO2: 23 mmol/L (ref 22–32)
pCO2 arterial: 46.7 mmHg (ref 32–48)
pH, Arterial: 7.289 — ABNORMAL LOW (ref 7.35–7.45)
pO2, Arterial: 91 mmHg (ref 83–108)

## 2021-09-24 LAB — BASIC METABOLIC PANEL
Anion gap: 7 (ref 5–15)
BUN: 39 mg/dL — ABNORMAL HIGH (ref 6–20)
CO2: 22 mmol/L (ref 22–32)
Calcium: 7.8 mg/dL — ABNORMAL LOW (ref 8.9–10.3)
Chloride: 109 mmol/L (ref 98–111)
Creatinine, Ser: 2.44 mg/dL — ABNORMAL HIGH (ref 0.61–1.24)
GFR, Estimated: 30 mL/min — ABNORMAL LOW (ref >60)
Glucose, Bld: 162 mg/dL — ABNORMAL HIGH (ref 70–99)
Potassium: 4.5 mmol/L (ref 3.5–5.1)
Sodium: 138 mmol/L (ref 135–145)

## 2021-09-24 LAB — GLUCOSE, CAPILLARY
Glucose-Capillary: 155 mg/dL — ABNORMAL HIGH (ref 70–99)
Glucose-Capillary: 159 mg/dL — ABNORMAL HIGH (ref 70–99)
Glucose-Capillary: 138 mg/dL — ABNORMAL HIGH (ref 70–99)
Glucose-Capillary: 141 mg/dL — ABNORMAL HIGH (ref 70–99)
Glucose-Capillary: 128 mg/dL — ABNORMAL HIGH (ref 70–99)
Glucose-Capillary: 131 mg/dL — ABNORMAL HIGH (ref 70–99)
Glucose-Capillary: 154 mg/dL — ABNORMAL HIGH (ref 70–99)

## 2021-09-24 LAB — CBC
HCT: 28.9 % — ABNORMAL LOW (ref 39.0–52.0)
Hemoglobin: 9.3 g/dL — ABNORMAL LOW (ref 13.0–17.0)
MCH: 30.1 pg (ref 26.0–34.0)
MCHC: 32.2 g/dL (ref 30.0–36.0)
MCV: 93.5 fL (ref 80.0–100.0)
Platelets: 161 10*3/uL (ref 150–400)
RBC: 3.09 MIL/uL — ABNORMAL LOW (ref 4.22–5.81)
RDW: 14.9 % (ref 11.5–15.5)
WBC: 19.1 10*3/uL — ABNORMAL HIGH (ref 4.0–10.5)
nRBC: 3.1 % — ABNORMAL HIGH (ref 0.0–0.2)

## 2021-09-24 LAB — TRIGLYCERIDES: Triglycerides: 91 mg/dL (ref <150)

## 2021-09-24 MED ORDER — FUROSEMIDE 10 MG/ML IJ SOLN
20.0000 mg | Freq: Once | INTRAMUSCULAR | Status: AC
Start: 2021-09-24 — End: 2021-09-24
  Administered 2021-09-24: 20 mg via INTRAVENOUS
  Filled 2021-09-24: qty 2

## 2021-09-24 MED ORDER — LACTULOSE 10 GM/15ML PO SOLN
20.0000 g | Freq: Once | ORAL | Status: AC
  Administered 2021-09-24: 20 g
  Filled 2021-09-24: qty 30

## 2021-09-24 NOTE — Progress Notes (Signed)
Patient ID: Ethan Moore, male   DOB: 10/26/1960, 61 y.o.   MRN: [id number] BP 120/69   Pulse (!) 113   Temp (!) 100.4 F (38 C)   Resp 19   Ht 6' (1.829 m)   Wt 98.6 kg   SpO2 99%   BMI 29.48 kg/m  Intubated, sedated No movement observed in the lower extremities Which I expect.  No recommendations at this time
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
## 2021-09-24 NOTE — Progress Notes (Signed)
Patient ID: Ethan Moore, male   DOB: [birth year], 61 y.o.   MRN: [id number] I updated one of his daughters at the bedside. We then called another daughter and updated her as well.  Violeta Gelinas, MD, MPH, FACS Please use [email or web address] to contact on call provider

## 2021-09-24 NOTE — Progress Notes (Signed)
RT note- called to assess sp02 85%, BBS veery coarse on Rt., suctioned for small clear thin secretions. Fio2 increased to 60% at this time, continue to monitor.

## 2021-09-25 ENCOUNTER — Encounter (HOSPITAL_COMMUNITY): Payer: Self-pay

## 2021-09-25 ENCOUNTER — Inpatient Hospital Stay (HOSPITAL_COMMUNITY): Payer: Non-veteran care

## 2021-09-25 ENCOUNTER — Inpatient Hospital Stay (HOSPITAL_COMMUNITY): Payer: Non-veteran care | Admitting: Anesthesiology

## 2021-09-25 ENCOUNTER — Encounter (HOSPITAL_COMMUNITY): Admission: EM | Disposition: E | Payer: Self-pay | Source: Home / Self Care

## 2021-09-25 DIAGNOSIS — S3630XA Unspecified injury of stomach, initial encounter: Secondary | ICD-10-CM

## 2021-09-25 DIAGNOSIS — D649 Anemia, unspecified: Secondary | ICD-10-CM

## 2021-09-25 DIAGNOSIS — S36428A Contusion of other part of small intestine, initial encounter: Secondary | ICD-10-CM

## 2021-09-25 DIAGNOSIS — S36118A Other injury of liver, initial encounter: Secondary | ICD-10-CM

## 2021-09-25 HISTORY — PX: LAPAROTOMY: SHX154

## 2021-09-25 HISTORY — PX: ESOPHAGOGASTRODUODENOSCOPY: SHX5428

## 2021-09-25 LAB — CBC
HCT: 26.9 % — ABNORMAL LOW (ref 39.0–52.0)
Hemoglobin: 8.6 g/dL — ABNORMAL LOW (ref 13.0–17.0)
MCH: 29.9 pg (ref 26.0–34.0)
MCHC: 32 g/dL (ref 30.0–36.0)
MCV: 93.4 fL (ref 80.0–100.0)
Platelets: 220 10*3/uL (ref 150–400)
RBC: 2.88 MIL/uL — ABNORMAL LOW (ref 4.22–5.81)
RDW: 15 % (ref 11.5–15.5)
WBC: 21.1 10*3/uL — ABNORMAL HIGH (ref 4.0–10.5)
nRBC: 9.5 % — ABNORMAL HIGH (ref 0.0–0.2)

## 2021-09-25 LAB — POCT I-STAT 7, (LYTES, BLD GAS, ICA,H+H)
Acid-base deficit: 5 mmol/L — ABNORMAL HIGH (ref 0.0–2.0)
Acid-base deficit: 5 mmol/L — ABNORMAL HIGH (ref 0.0–2.0)
Bicarbonate: 22.7 mmol/L (ref 20.0–28.0)
Bicarbonate: 21.1 mmol/L (ref 20.0–28.0)
Calcium, Ion: 1.17 mmol/L (ref 1.15–1.40)
Calcium, Ion: 1.11 mmol/L — ABNORMAL LOW (ref 1.15–1.40)
HCT: 28 % — ABNORMAL LOW (ref 39.0–52.0)
HCT: 28 % — ABNORMAL LOW (ref 39.0–52.0)
Hemoglobin: 9.5 g/dL — ABNORMAL LOW (ref 13.0–17.0)
Hemoglobin: 9.5 g/dL — ABNORMAL LOW (ref 13.0–17.0)
O2 Saturation: 90 %
O2 Saturation: 94 %
Potassium: 4.8 mmol/L (ref 3.5–5.1)
Potassium: 4.9 mmol/L (ref 3.5–5.1)
Sodium: 139 mmol/L (ref 135–145)
Sodium: 137 mmol/L (ref 135–145)
TCO2: 24 mmol/L (ref 22–32)
TCO2: 22 mmol/L (ref 22–32)
pCO2 arterial: 52.3 mmHg — ABNORMAL HIGH (ref 32–48)
pCO2 arterial: 42.4 mmHg (ref 32–48)
pH, Arterial: 7.246 — ABNORMAL LOW (ref 7.35–7.45)
pH, Arterial: 7.305 — ABNORMAL LOW (ref 7.35–7.45)
pO2, Arterial: 69 mmHg — ABNORMAL LOW (ref 83–108)
pO2, Arterial: 76 mmHg — ABNORMAL LOW (ref 83–108)

## 2021-09-25 LAB — GLUCOSE, CAPILLARY
Glucose-Capillary: 123 mg/dL — ABNORMAL HIGH (ref 70–99)
Glucose-Capillary: 157 mg/dL — ABNORMAL HIGH (ref 70–99)
Glucose-Capillary: 134 mg/dL — ABNORMAL HIGH (ref 70–99)
Glucose-Capillary: 144 mg/dL — ABNORMAL HIGH (ref 70–99)
Glucose-Capillary: 135 mg/dL — ABNORMAL HIGH (ref 70–99)

## 2021-09-25 LAB — BASIC METABOLIC PANEL
Anion gap: 6 (ref 5–15)
BUN: 58 mg/dL — ABNORMAL HIGH (ref 6–20)
CO2: 23 mmol/L (ref 22–32)
Calcium: 8 mg/dL — ABNORMAL LOW (ref 8.9–10.3)
Chloride: 111 mmol/L (ref 98–111)
Creatinine, Ser: 2.85 mg/dL — ABNORMAL HIGH (ref 0.61–1.24)
GFR, Estimated: 25 mL/min — ABNORMAL LOW (ref >60)
Glucose, Bld: 132 mg/dL — ABNORMAL HIGH (ref 70–99)
Potassium: 4.3 mmol/L (ref 3.5–5.1)
Sodium: 140 mmol/L (ref 135–145)

## 2021-09-25 LAB — CULTURE, RESPIRATORY W GRAM STAIN: Culture: NORMAL

## 2021-09-25 LAB — PREPARE RBC (CROSSMATCH)

## 2021-09-25 SURGERY — LAPAROTOMY, EXPLORATORY
Anesthesia: General | Site: Mouth

## 2021-09-25 MED ORDER — MIDAZOLAM HCL 2 MG/2ML IJ SOLN
INTRAMUSCULAR | Status: DC | PRN
Start: 2021-09-25 — End: 2021-09-25
  Administered 2021-09-25 (×2): 2 mg via INTRAVENOUS

## 2021-09-25 MED ORDER — FENTANYL CITRATE (PF) 250 MCG/5ML IJ SOLN
INTRAMUSCULAR | Status: AC
  Filled 2021-09-25: qty 5

## 2021-09-25 MED ORDER — ACETAMINOPHEN 10 MG/ML IV SOLN
INTRAVENOUS | Status: AC
  Filled 2021-09-25: qty 100

## 2021-09-25 MED ORDER — ENOXAPARIN SODIUM 30 MG/0.3ML IJ SOSY
30.0000 mg | PREFILLED_SYRINGE | Freq: Two times a day (BID) | INTRAMUSCULAR | Status: DC
  Administered 2021-09-25 – 2021-09-27 (×6): 30 mg via SUBCUTANEOUS
  Filled 2021-09-25 (×6): qty 0.3

## 2021-09-25 MED ORDER — OXYCODONE HCL 5 MG/5ML PO SOLN
5.0000 mg | ORAL | Status: DC | PRN
  Administered 2021-09-30 (×2): 10 mg
  Filled 2021-09-25 (×4): qty 10

## 2021-09-25 MED ORDER — MIDAZOLAM HCL 2 MG/2ML IJ SOLN
INTRAMUSCULAR | Status: AC
  Filled 2021-09-25: qty 2

## 2021-09-25 MED ORDER — ROCURONIUM BROMIDE 10 MG/ML (PF) SYRINGE
PREFILLED_SYRINGE | INTRAVENOUS | Status: DC | PRN
  Administered 2021-09-25 (×3): 50 mg via INTRAVENOUS

## 2021-09-25 MED ORDER — PNEUMOCOCCAL 20-VAL CONJ VACC 0.5 ML IM SUSY: 0.5000 mL | PREFILLED_SYRINGE | INTRAMUSCULAR | Status: DC

## 2021-09-25 MED ORDER — HAEMOPHILUS B POLYSAC CONJ VAC 10 MCG IJ SOLR: 0.5000 mL | Freq: Once | INTRAMUSCULAR | Status: DC

## 2021-09-25 MED ORDER — ALBUMIN HUMAN 5 % IV SOLN: INTRAVENOUS | Status: DC | PRN

## 2021-09-25 MED ORDER — LACTATED RINGERS IV SOLN: INTRAVENOUS | Status: DC | PRN

## 2021-09-25 MED ORDER — LACTATED RINGERS IV BOLUS
1000.0000 mL | Freq: Once | INTRAVENOUS | Status: AC
  Administered 2021-09-25: 1000 mL via INTRAVENOUS

## 2021-09-25 MED ORDER — DEXAMETHASONE SODIUM PHOSPHATE 10 MG/ML IJ SOLN
INTRAMUSCULAR | Status: AC
  Filled 2021-09-25: qty 1

## 2021-09-25 MED ORDER — ONDANSETRON HCL 4 MG/2ML IJ SOLN
INTRAMUSCULAR | Status: AC
  Filled 2021-09-25: qty 2

## 2021-09-25 MED ORDER — 0.9 % SODIUM CHLORIDE (POUR BTL) OPTIME
TOPICAL | Status: DC | PRN
  Administered 2021-09-25 (×3): 1000 mL

## 2021-09-25 MED ORDER — IOHEXOL 9 MG/ML PO SOLN
500.0000 mL | ORAL | Status: AC
  Administered 2021-09-25 (×2): 500 mL via ORAL

## 2021-09-25 MED ORDER — MENINGOCOCCAL A C Y&W-135 OLIG IM SOLR: 0.5000 mL | Freq: Once | INTRAMUSCULAR | Status: DC

## 2021-09-25 MED ORDER — ACETAMINOPHEN 10 MG/ML IV SOLN
INTRAVENOUS | Status: DC | PRN
  Administered 2021-09-25: 1000 mg via INTRAVENOUS

## 2021-09-25 MED ORDER — DEXAMETHASONE SODIUM PHOSPHATE 10 MG/ML IJ SOLN
INTRAMUSCULAR | Status: DC | PRN
  Administered 2021-09-25: 10 mg via INTRAVENOUS

## 2021-09-25 MED ORDER — PIPERACILLIN-TAZOBACTAM 3.375 G IVPB
3.3750 g | Freq: Three times a day (TID) | INTRAVENOUS | Status: DC
  Administered 2021-09-25 – 2021-09-29 (×11): 3.375 g via INTRAVENOUS
  Filled 2021-09-25 (×11): qty 50

## 2021-09-25 MED ORDER — FLUCONAZOLE IN SODIUM CHLORIDE 400-0.9 MG/200ML-% IV SOLN
400.0000 mg | INTRAVENOUS | Status: DC
  Administered 2021-09-25: 400 mg via INTRAVENOUS
  Filled 2021-09-25: qty 200

## 2021-09-25 MED ORDER — MIDAZOLAM HCL 2 MG/2ML IJ SOLN
2.0000 mg | INTRAMUSCULAR | Status: DC | PRN
  Administered 2021-09-26 – 2021-09-30 (×15): 2 mg via INTRAVENOUS
  Filled 2021-09-25 (×12): qty 2

## 2021-09-25 MED ORDER — DEXMEDETOMIDINE HCL IN NACL 400 MCG/100ML IV SOLN
0.4000 ug/kg/h | INTRAVENOUS | Status: DC
  Administered 2021-09-25: 0.6 ug/kg/h via INTRAVENOUS
  Administered 2021-09-25: 0.4 ug/kg/h via INTRAVENOUS
  Administered 2021-09-25: 0.6 ug/kg/h via INTRAVENOUS
  Administered 2021-09-26 (×2): 1 ug/kg/h via INTRAVENOUS
  Administered 2021-09-26 (×2): 0.7 ug/kg/h via INTRAVENOUS
  Administered 2021-09-26 – 2021-09-27 (×2): 1 ug/kg/h via INTRAVENOUS
  Administered 2021-09-27 – 2021-09-29 (×13): 1.2 ug/kg/h via INTRAVENOUS
  Administered 2021-09-29 (×5): 1 ug/kg/h via INTRAVENOUS
  Administered 2021-09-30: 0.4 ug/kg/h via INTRAVENOUS
  Administered 2021-09-30 (×2): 1.2 ug/kg/h via INTRAVENOUS
  Administered 2021-09-30: 2 ug/kg/h via INTRAVENOUS
  Administered 2021-09-30: 1 ug/kg/h via INTRAVENOUS
  Administered 2021-10-01: 1.2 ug/kg/h via INTRAVENOUS
  Administered 2021-10-01: 0.8 ug/kg/h via INTRAVENOUS
  Administered 2021-10-01 (×3): 1.2 ug/kg/h via INTRAVENOUS
  Administered 2021-10-01: 0.8 ug/kg/h via INTRAVENOUS
  Administered 2021-10-02: 0.4 ug/kg/h via INTRAVENOUS
  Filled 2021-09-25: qty 200
  Filled 2021-09-25 (×16): qty 100
  Filled 2021-09-25 (×2): qty 200
  Filled 2021-09-25 (×7): qty 100
  Filled 2021-09-25: qty 200
  Filled 2021-09-25 (×4): qty 100
  Filled 2021-09-25: qty 200
  Filled 2021-09-25: qty 300
  Filled 2021-09-25 (×2): qty 100

## 2021-09-25 MED ORDER — ONDANSETRON HCL 4 MG/2ML IJ SOLN
INTRAMUSCULAR | Status: DC | PRN
  Administered 2021-09-25: 4 mg via INTRAVENOUS

## 2021-09-25 SURGICAL SUPPLY — 60 items
BIOPATCH RED 1 DISK 7.0 (GAUZE/BANDAGES/DRESSINGS) ×1 IMPLANT
BLADE CLIPPER SURG (BLADE) IMPLANT
BNDG GAUZE ELAST 4 BULKY (GAUZE/BANDAGES/DRESSINGS) ×1 IMPLANT
BUTTON OLYMPUS DEFENDO 5 PIECE (MISCELLANEOUS) ×1 IMPLANT
CANISTER SUCT 3000ML PPV (MISCELLANEOUS) ×3 IMPLANT
CHLORAPREP W/TINT 26 (MISCELLANEOUS) ×3 IMPLANT
COVER SURGICAL LIGHT HANDLE (MISCELLANEOUS) ×3 IMPLANT
DRAIN CHANNEL 19F RND (DRAIN) ×1 IMPLANT
DRAPE LAPAROSCOPIC ABDOMINAL (DRAPES) ×3 IMPLANT
DRAPE UNIVERSAL (DRAPES) ×3 IMPLANT
DRAPE WARM FLUID 44X44 (DRAPES) ×3 IMPLANT
DRSG OPSITE POSTOP 4X10 (GAUZE/BANDAGES/DRESSINGS) IMPLANT
DRSG OPSITE POSTOP 4X8 (GAUZE/BANDAGES/DRESSINGS) IMPLANT
DRSG TEGADERM 4X4.75 (GAUZE/BANDAGES/DRESSINGS) ×1 IMPLANT
ELECT BLADE 4.0 EZ CLEAN MEGAD (MISCELLANEOUS) ×3
ELECT BLADE 6.5 EXT (BLADE) IMPLANT
ELECT CAUTERY BLADE 6.4 (BLADE) ×3 IMPLANT
ELECT REM PT RETURN 9FT ADLT (ELECTROSURGICAL) ×3
ELECTRODE BLDE 4.0 EZ CLN MEGD (MISCELLANEOUS) IMPLANT
ELECTRODE REM PT RTRN 9FT ADLT (ELECTROSURGICAL) ×2 IMPLANT
EVACUATOR SILICONE 100CC (DRAIN) ×1 IMPLANT
GAUZE SPONGE 4X4 12PLY STRL LF (GAUZE/BANDAGES/DRESSINGS) ×1 IMPLANT
GLOVE BIO SURGEON STRL SZ 6 (GLOVE) ×1 IMPLANT
GLOVE BIO SURGEON STRL SZ 6.5 (GLOVE) ×3 IMPLANT
GLOVE BIO SURGEON STRL SZ7 (GLOVE) ×2 IMPLANT
GLOVE BIOGEL M 6.5 STRL (GLOVE) ×1 IMPLANT
GLOVE BIOGEL M 7.0 STRL (GLOVE) ×1 IMPLANT
GLOVE BIOGEL PI IND STRL 6 (GLOVE) ×2 IMPLANT
GLOVE BIOGEL PI IND STRL 6.5 (GLOVE) IMPLANT
GLOVE BIOGEL PI INDICATOR 6 (GLOVE) ×1
GLOVE BIOGEL PI INDICATOR 6.5 (GLOVE) ×1
GOWN STRL REUS W/ TWL LRG LVL3 (GOWN DISPOSABLE) ×4 IMPLANT
GOWN STRL REUS W/TWL LRG LVL3 (GOWN DISPOSABLE) ×12
HANDLE SUCTION POOLE (INSTRUMENTS) ×2 IMPLANT
KIT BASIN OR (CUSTOM PROCEDURE TRAY) ×3 IMPLANT
KIT TURNOVER KIT B (KITS) ×3 IMPLANT
LIGASURE IMPACT 36 18CM CVD LR (INSTRUMENTS) IMPLANT
NS IRRIG 1000ML POUR BTL (IV SOLUTION) ×6 IMPLANT
PACK GENERAL/GYN (CUSTOM PROCEDURE TRAY) ×3 IMPLANT
PAD ABD 8X10 STRL (GAUZE/BANDAGES/DRESSINGS) ×3 IMPLANT
PAD ARMBOARD 7.5X6 YLW CONV (MISCELLANEOUS) ×3 IMPLANT
PENCIL SMOKE EVACUATOR (MISCELLANEOUS) ×2 IMPLANT
SPONGE T-LAP 18X18 ~~LOC~~+RFID (SPONGE) IMPLANT
STAPLER VISISTAT 35W (STAPLE) ×2 IMPLANT
SUCTION POOLE HANDLE (INSTRUMENTS) ×3
SUT ETHILON 2 0 FS 18 (SUTURE) ×1 IMPLANT
SUT PDS AB 1 TP1 54 (SUTURE) IMPLANT
SUT PDS AB 1 TP1 96 (SUTURE) ×2 IMPLANT
SUT SILK 0 TIES 10X30 (SUTURE) ×1 IMPLANT
SUT SILK 2 0 SH CR/8 (SUTURE) ×4 IMPLANT
SUT SILK 2 0 TIES 10X30 (SUTURE) ×3 IMPLANT
SUT SILK 2 0SH CR/8 30 (SUTURE) ×1 IMPLANT
SUT SILK 3 0 SH CR/8 (SUTURE) ×3 IMPLANT
SUT SILK 3 0 TIES 10X30 (SUTURE) ×3 IMPLANT
SUT VIC AB 3-0 SH 18 (SUTURE) IMPLANT
TOWEL GREEN STERILE (TOWEL DISPOSABLE) ×3 IMPLANT
TRAY FOLEY MTR SLVR 16FR STAT (SET/KITS/TRAYS/PACK) IMPLANT
TUBE CONNECTING 20X1/4 (TUBING) ×1 IMPLANT
TUBING ENDO SMARTCAP (MISCELLANEOUS) ×1 IMPLANT
YANKAUER SUCT BULB TIP NO VENT (SUCTIONS) ×1 IMPLANT
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
## 2021-09-25 NOTE — Anesthesia Postprocedure Evaluation (Signed)
Anesthesia Post Note  Patient: Ethan Moore  Procedure(s) Performed: EXPLORATORY LAPAROTOMY (Abdomen) ESOPHAGOGASTRODUODENOSCOPY (EGD) (Mouth)     Patient location during evaluation: SICU Anesthesia Type: General Level of consciousness: sedated Pain management: pain level controlled Vital Signs Assessment: post-procedure vital signs reviewed and stable Respiratory status: patient remains intubated per anesthesia plan Cardiovascular status: stable Postop Assessment: no apparent nausea or vomiting Anesthetic complications: no   No notable events documented.  Last Vitals:  Vitals:   09/21/2021 1422 09/17/2021 1745  BP:  111/74  Pulse: (!) 103 81  Resp: 20 18  Temp:  36.5 C  SpO2: 100% 99%    Last Pain:  Vitals:   09/23/2021 0000  TempSrc: Esophageal  PainSc:                  Lisaanne Lawrie,W. EDMOND
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
## 2021-09-25 NOTE — Progress Notes (Signed)
Patient ID: Ethan Moore, male   DOB: Feb 18, 1961, 61 y.o.   MRN: [id number] BP 121/76   Pulse 78   Temp 98.8 F (37.1 C)   Resp 18   Ht 6' (1.829 m)   Wt 98.6 kg   SpO2 96%   BMI 29.48 kg/m  Alert, following commands Unable to move lower extremities Have spoken with his daughter this morning.  Spine is stable

## 2021-09-25 NOTE — Progress Notes (Signed)
Cortrak Tube Team Note:  Consult received to place a Cortrak feeding tube.   MD in room and plans to take pt for emergency ex-lap. D/C'ed cortrak consult. Please re-consult as needed.     Cammy Copa., RD, LDN, CNSC See AMiON for contact information
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
## 2021-09-25 NOTE — Progress Notes (Signed)
zPharmacy Antibiotic Note  Ethan Moore is a 61 y.o. male admitted on 2021/09/30 with  intra-abdominal infection .  Pharmacy has been consulted for zosyn dosing.  Plan: Zosyn 3.375g IV q8h (4 hour infusion). Will monitor for acute changes in renal function and adjust as needed F/u cultures results and de-escalate as appropriate   Height: 6' (182.9 cm) Weight: 98.6 kg (217 lb 6 oz) IBW/kg (Calculated) : 77.6  Temp (24hrs), Avg:99.5 F (37.5 C), Min:96.8 F (36 C), Max:100.6 F (38.1 C)  Recent Labs  Lab 30-Sep-2021 0513 09-30-2021 1845 09/22/21 0514 09/23/21 0349 09/24/21 0440 09/17/2021 0536  WBC  --  9.1  --  13.3* 19.1* 21.1*  CREATININE 1.00  1.00  --  1.68* 1.66* 2.44* 2.85*    Estimated Creatinine Clearance: 33.9 mL/min (A) (by C-G formula based on SCr of 2.85 mg/dL (H)).    No Known Allergies  Thank you for allowing pharmacy to be a part of this patient's care.  Tomie China, PharmD Clinical Pharmacist  Please check AMION for all [facility] numbers After 10:00 PM, call Main Pharmacy [phone]
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
## 2021-09-25 NOTE — Progress Notes (Signed)
Gave 2mg  versed before CT scan for agitation.

## 2021-09-25 NOTE — Transfer of Care (Signed)
Immediate Anesthesia Transfer of Care Note  Patient: Ethan Moore  Procedure(s) Performed: EXPLORATORY LAPAROTOMY (Abdomen) ESOPHAGOGASTRODUODENOSCOPY (EGD) (Mouth)  Patient Location: ICU  Anesthesia Type:General  Level of Consciousness: Patient remains intubated per anesthesia plan  Airway & Oxygen Therapy: Patient remains intubated per anesthesia plan and Patient placed on Ventilator (see vital sign flow sheet for setting)  Post-op Assessment: Report given to RN and Post -op Vital signs reviewed and stable  Post vital signs: Reviewed and stable  Last Vitals:  Vitals Value Taken Time  BP 111/74 09/05/2021 1745  Temp 36.6 C 09/07/2021 1749  Pulse 81 09/24/2021 1749  Resp 18 09/23/2021 1749  SpO2 99 % 09/24/2021 1749  Vitals shown include unvalidated device data.  Last Pain:  Vitals:   09/19/2021 0000  TempSrc: Esophageal  PainSc:          Complications: No notable events documented.

## 2021-09-25 NOTE — Progress Notes (Signed)
Pt transported to and from CT scan on the ventilator without incident. 
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
## 2021-09-26 ENCOUNTER — Encounter (HOSPITAL_COMMUNITY): Payer: Self-pay | Admitting: Surgery

## 2021-09-26 ENCOUNTER — Other Ambulatory Visit: Payer: Self-pay

## 2021-09-26 LAB — CBC
HCT: 27.6 % — ABNORMAL LOW (ref 39.0–52.0)
Hemoglobin: 9 g/dL — ABNORMAL LOW (ref 13.0–17.0)
MCH: 30.1 pg (ref 26.0–34.0)
MCHC: 32.6 g/dL (ref 30.0–36.0)
MCV: 92.3 fL (ref 80.0–100.0)
Platelets: 269 10*3/uL (ref 150–400)
RBC: 2.99 MIL/uL — ABNORMAL LOW (ref 4.22–5.81)
RDW: 15.4 % (ref 11.5–15.5)
WBC: 28.3 10*3/uL — ABNORMAL HIGH (ref 4.0–10.5)
nRBC: 5.2 % — ABNORMAL HIGH (ref 0.0–0.2)

## 2021-09-26 LAB — GLUCOSE, CAPILLARY
Glucose-Capillary: 162 mg/dL — ABNORMAL HIGH (ref 70–99)
Glucose-Capillary: 140 mg/dL — ABNORMAL HIGH (ref 70–99)
Glucose-Capillary: 128 mg/dL — ABNORMAL HIGH (ref 70–99)
Glucose-Capillary: 123 mg/dL — ABNORMAL HIGH (ref 70–99)

## 2021-09-26 LAB — BASIC METABOLIC PANEL
Anion gap: 8 (ref 5–15)
BUN: 68 mg/dL — ABNORMAL HIGH (ref 6–20)
CO2: 21 mmol/L — ABNORMAL LOW (ref 22–32)
Calcium: 7.9 mg/dL — ABNORMAL LOW (ref 8.9–10.3)
Chloride: 110 mmol/L (ref 98–111)
Creatinine, Ser: 2.73 mg/dL — ABNORMAL HIGH (ref 0.61–1.24)
GFR, Estimated: 26 mL/min — ABNORMAL LOW (ref >60)
Glucose, Bld: 144 mg/dL — ABNORMAL HIGH (ref 70–99)
Potassium: 5.4 mmol/L — ABNORMAL HIGH (ref 3.5–5.1)
Sodium: 139 mmol/L (ref 135–145)

## 2021-09-26 LAB — PATHOLOGIST SMEAR REVIEW

## 2021-09-26 MED ORDER — SODIUM CHLORIDE 0.9 % IV SOLN
100.0000 mg | Freq: Every day | INTRAVENOUS | Status: DC
  Administered 2021-09-26 – 2021-09-29 (×4): 100 mg via INTRAVENOUS
  Filled 2021-09-26: qty 5
  Filled 2021-09-26: qty 10
  Filled 2021-09-26: qty 5
  Filled 2021-09-26 (×2): qty 10

## 2021-09-26 MED ORDER — DEXTROSE 50 % IV SOLN
50.0000 mL | Freq: Once | INTRAVENOUS | Status: AC
  Administered 2021-09-26: 50 mL via INTRAVENOUS
  Filled 2021-09-26: qty 50

## 2021-09-26 MED ORDER — SODIUM CHLORIDE 0.9 % IV SOLN
INTRAVENOUS | Status: AC
Start: 2021-09-26 — End: 2021-09-27

## 2021-09-26 MED ORDER — INSULIN ASPART 100 UNIT/ML IV SOLN
10.0000 [IU] | Freq: Once | INTRAVENOUS | Status: AC
  Administered 2021-09-26: 10 [IU] via INTRAVENOUS

## 2021-09-26 NOTE — Progress Notes (Signed)
Trauma Event Note   TRN at bedside to round. Patient intubated/sedated, no family presently at bedside. Febrile today, chest XRAY pending for AM d/t pleural effusion/ground glass opacities. Checked in with primary nurse, who has no needs for TRN at this time.  Last imported Vital Signs BP 114/62   Pulse 75   Temp (!) 101 F (38.3 C) (Axillary)   Resp (!) 21   Ht 6' (1.829 m)   Wt 217 lb 6 oz (98.6 kg)   SpO2 95%   BMI 29.48 kg/m   Trending CBC Recent Labs    09/24/21 0440 2021/10/07 0536 10-07-21 1531 10-07-2021 1634 09/26/21 0556  WBC 19.1* 21.1*  --   --  28.3*  HGB 9.3* 8.6* 9.5* 9.5* 9.0*  HCT 28.9* 26.9* 28.0* 28.0* 27.6*  PLT 161 220  --   --  269    Trending Coag's No results for input(s): APTT, INR in the last 72 hours.  Trending BMET Recent Labs    09/24/21 0440 2021/10/07 0536 10-07-21 1531 10-07-21 1634 09/26/21 0556  NA 138 140 139 137 139  K 4.5 4.3 4.8 4.9 5.4*  CL 109 111  --   --  110  CO2 22 23  --   --  21*  BUN 39* 58*  --   --  68*  CREATININE 2.44* 2.85*  --   --  2.73*  GLUCOSE 162* 132*  --   --  144*      Maveric Debono O Rondey Fallen  Trauma Response RN  Please call TRN at [phone] for further assistance.

## 2021-09-26 NOTE — Progress Notes (Signed)
Patient ID: Ethan Moore, male   DOB: July 26, 1960, 61 y.o.   MRN: [id number] I updated two of his children at the bedside.  Georganna Skeans, MD, MPH, FACS Please use [email or web address] to contact on call provider
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
## 2021-09-26 NOTE — Consult Note (Signed)
[place] team requested to apply Vac to abd wound tomorrow. Surgical team following for assessment and plan of care. Supplies ordered to the bedside and we will plan to apply to midline post-op wound as requested Fri morning. Julien Girt MSN, RN, [place], [place], [place]

## 2021-09-27 ENCOUNTER — Inpatient Hospital Stay: Payer: Self-pay

## 2021-09-27 ENCOUNTER — Inpatient Hospital Stay (HOSPITAL_COMMUNITY): Payer: Non-veteran care

## 2021-09-27 LAB — POCT I-STAT 7, (LYTES, BLD GAS, ICA,H+H)
Acid-base deficit: 3 mmol/L — ABNORMAL HIGH (ref 0.0–2.0)
Acid-base deficit: 3 mmol/L — ABNORMAL HIGH (ref 0.0–2.0)
Bicarbonate: 20.9 mmol/L (ref 20.0–28.0)
Bicarbonate: 21.7 mmol/L (ref 20.0–28.0)
Calcium, Ion: 1.09 mmol/L — ABNORMAL LOW (ref 1.15–1.40)
Calcium, Ion: 1.09 mmol/L — ABNORMAL LOW (ref 1.15–1.40)
HCT: 24 % — ABNORMAL LOW (ref 39.0–52.0)
HCT: 23 % — ABNORMAL LOW (ref 39.0–52.0)
Hemoglobin: 8.2 g/dL — ABNORMAL LOW (ref 13.0–17.0)
Hemoglobin: 7.8 g/dL — ABNORMAL LOW (ref 13.0–17.0)
O2 Saturation: 100 %
O2 Saturation: 100 %
Patient temperature: 100.8
Patient temperature: 100.8
Potassium: 4.8 mmol/L (ref 3.5–5.1)
Potassium: 4.7 mmol/L (ref 3.5–5.1)
Sodium: 144 mmol/L (ref 135–145)
Sodium: 144 mmol/L (ref 135–145)
TCO2: 22 mmol/L (ref 22–32)
TCO2: 23 mmol/L (ref 22–32)
pCO2 arterial: 33.8 mmHg (ref 32–48)
pCO2 arterial: 36.3 mmHg (ref 32–48)
pH, Arterial: 7.405 (ref 7.35–7.45)
pH, Arterial: 7.389 (ref 7.35–7.45)
pO2, Arterial: 172 mmHg — ABNORMAL HIGH (ref 83–108)
pO2, Arterial: 202 mmHg — ABNORMAL HIGH (ref 83–108)

## 2021-09-27 LAB — MAGNESIUM: Magnesium: 2.6 mg/dL — ABNORMAL HIGH (ref 1.7–2.4)

## 2021-09-27 LAB — BASIC METABOLIC PANEL
Anion gap: 6 (ref 5–15)
BUN: 83 mg/dL — ABNORMAL HIGH (ref 6–20)
CO2: 20 mmol/L — ABNORMAL LOW (ref 22–32)
Calcium: 7.6 mg/dL — ABNORMAL LOW (ref 8.9–10.3)
Chloride: 117 mmol/L — ABNORMAL HIGH (ref 98–111)
Creatinine, Ser: 2.85 mg/dL — ABNORMAL HIGH (ref 0.61–1.24)
GFR, Estimated: 25 mL/min — ABNORMAL LOW (ref >60)
Glucose, Bld: 110 mg/dL — ABNORMAL HIGH (ref 70–99)
Potassium: 4.7 mmol/L (ref 3.5–5.1)
Sodium: 143 mmol/L (ref 135–145)

## 2021-09-27 LAB — GLUCOSE, CAPILLARY
Glucose-Capillary: 102 mg/dL — ABNORMAL HIGH (ref 70–99)
Glucose-Capillary: 106 mg/dL — ABNORMAL HIGH (ref 70–99)
Glucose-Capillary: 107 mg/dL — ABNORMAL HIGH (ref 70–99)
Glucose-Capillary: 111 mg/dL — ABNORMAL HIGH (ref 70–99)
Glucose-Capillary: 113 mg/dL — ABNORMAL HIGH (ref 70–99)
Glucose-Capillary: 126 mg/dL — ABNORMAL HIGH (ref 70–99)
Glucose-Capillary: 91 mg/dL (ref 70–99)

## 2021-09-27 LAB — CBC
HCT: 23.5 % — ABNORMAL LOW (ref 39.0–52.0)
Hemoglobin: 7.8 g/dL — ABNORMAL LOW (ref 13.0–17.0)
MCH: 30.5 pg (ref 26.0–34.0)
MCHC: 33.2 g/dL (ref 30.0–36.0)
MCV: 91.8 fL (ref 80.0–100.0)
Platelets: 297 10*3/uL (ref 150–400)
RBC: 2.56 MIL/uL — ABNORMAL LOW (ref 4.22–5.81)
RDW: 15.7 % — ABNORMAL HIGH (ref 11.5–15.5)
WBC: 32.6 10*3/uL — ABNORMAL HIGH (ref 4.0–10.5)
nRBC: 3 % — ABNORMAL HIGH (ref 0.0–0.2)

## 2021-09-27 LAB — TRIGLYCERIDES: Triglycerides: 117 mg/dL (ref <150)

## 2021-09-27 LAB — PHOSPHORUS: Phosphorus: 2.5 mg/dL (ref 2.5–4.6)

## 2021-09-27 MED ORDER — FUROSEMIDE 10 MG/ML IJ SOLN: 20.0000 mg | Freq: Once | INTRAMUSCULAR | Status: AC

## 2021-09-27 MED ORDER — ALBUTEROL SULFATE (2.5 MG/3ML) 0.083% IN NEBU
2.5000 mg | INHALATION_SOLUTION | RESPIRATORY_TRACT | Status: DC
  Administered 2021-09-27: 2.5 mg via RESPIRATORY_TRACT

## 2021-09-27 MED ORDER — ALBUTEROL SULFATE (2.5 MG/3ML) 0.083% IN NEBU: 2.5000 mg | INHALATION_SOLUTION | RESPIRATORY_TRACT | Status: DC | PRN

## 2021-09-27 MED ORDER — FUROSEMIDE 10 MG/ML IJ SOLN
INTRAMUSCULAR | Status: AC
  Administered 2021-09-27: 20 mg via INTRAVENOUS
  Filled 2021-09-27: qty 2

## 2021-09-27 MED ORDER — SODIUM CHLORIDE 0.9 % IV SOLN: INTRAVENOUS | Status: DC

## 2021-09-27 MED ORDER — ALBUTEROL SULFATE (2.5 MG/3ML) 0.083% IN NEBU
INHALATION_SOLUTION | RESPIRATORY_TRACT | Status: AC
  Administered 2021-09-27: 2.5 mg via RESPIRATORY_TRACT
  Filled 2021-09-27: qty 3

## 2021-09-27 MED ORDER — MIDAZOLAM-SODIUM CHLORIDE 100-0.9 MG/100ML-% IV SOLN
0.0000 mg/h | INTRAVENOUS | Status: DC
  Administered 2021-09-27: 1 mg/h via INTRAVENOUS
  Administered 2021-09-29: 4 mg/h via INTRAVENOUS
  Administered 2021-09-30: 3 mg/h via INTRAVENOUS
  Administered 2021-10-01: 4 mg/h via INTRAVENOUS
  Filled 2021-09-27 (×5): qty 100

## 2021-09-27 MED ORDER — SODIUM CHLORIDE 3 % IN NEBU
4.0000 mL | INHALATION_SOLUTION | Freq: Two times a day (BID) | RESPIRATORY_TRACT | Status: AC
  Administered 2021-09-27 – 2021-09-29 (×6): 4 mL via RESPIRATORY_TRACT
  Filled 2021-09-27 (×6): qty 15

## 2021-09-27 MED ORDER — ACETAMINOPHEN 650 MG RE SUPP: 650.0000 mg | RECTAL | Status: DC | PRN

## 2021-09-27 MED ORDER — TRAVASOL 10 % IV SOLN: INTRAVENOUS | Status: DC

## 2021-09-27 MED ORDER — SODIUM CHLORIDE 0.9% FLUSH
10.0000 mL | Freq: Two times a day (BID) | INTRAVENOUS | Status: DC
  Administered 2021-09-27: 10 mL
  Administered 2021-09-27: 40 mL
  Administered 2021-09-28 – 2021-09-30 (×3): 10 mL
  Administered 2021-09-30 – 2021-10-01 (×2): 30 mL
  Administered 2021-10-01: 20 mL

## 2021-09-27 MED ORDER — SODIUM CHLORIDE 0.9% FLUSH: 10.0000 mL | INTRAVENOUS | Status: DC | PRN

## 2021-09-27 MED ORDER — INSULIN ASPART 100 UNIT/ML IJ SOLN
0.0000 [IU] | Freq: Four times a day (QID) | INTRAMUSCULAR | Status: DC
  Administered 2021-09-27: 1 [IU] via SUBCUTANEOUS
  Administered 2021-09-28 (×2): 2 [IU] via SUBCUTANEOUS
  Administered 2021-09-28 – 2021-09-29 (×4): 1 [IU] via SUBCUTANEOUS
  Administered 2021-09-29: 2 [IU] via SUBCUTANEOUS
  Administered 2021-09-29: 1 [IU] via SUBCUTANEOUS
  Administered 2021-09-30 (×4): 2 [IU] via SUBCUTANEOUS
  Administered 2021-10-01: 3 [IU] via SUBCUTANEOUS

## 2021-09-27 MED ORDER — TRAVASOL 10 % IV SOLN
INTRAVENOUS | Status: AC
  Filled 2021-09-27: qty 566.4

## 2021-09-27 MED ORDER — PANTOPRAZOLE SODIUM 40 MG IV SOLR
40.0000 mg | INTRAVENOUS | Status: DC
  Administered 2021-09-27 – 2021-10-01 (×5): 40 mg via INTRAVENOUS
  Filled 2021-09-27 (×5): qty 10

## 2021-09-27 NOTE — Consult Note (Addendum)
WOC Nurse Consult Note: Surgical team following for assessment and plan of care. Requested to apply Vac to midline full thickness post-op wound. 23X4X4cm, beefy red interspersed with yellow adipose.   Pt is intubated and sedated and tolerated without apparent discomfort.  One piece black foam applied to cont suction.  WOC team will plan to change again on Mon. Cammie Mcgee MSN, RN, CWOCN, [place], CNS [phone]
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
## 2021-09-27 NOTE — Progress Notes (Signed)
Peripherally Inserted Central Catheter Placement  The IV Nurse has discussed with the patient and/or persons authorized to consent for the patient, the purpose of this procedure and the potential benefits and risks involved with this procedure.  The benefits include less needle sticks, lab draws from the catheter, and the patient may be discharged home with the catheter. Risks include, but not limited to, infection, bleeding, blood clot (thrombus formation), and puncture of an artery; nerve damage and irregular heartbeat and possibility to perform a PICC exchange if needed/ordered by physician.  Alternatives to this procedure were also discussed.  Bard Power PICC patient education guide, fact sheet on infection prevention and patient information card has been provided to patient /or left at bedside.    PICC Placement Documentation  PICC Triple Lumen 09/27/21 Right Brachial 44 cm 0 cm (Active)  Indication for Insertion or Continuance of Line Administration of hyperosmolar/irritating solutions (i.e. TPN, Vancomycin, etc.) 09/27/21 1200  Exposed Catheter (cm) 0 cm 09/27/21 1200  Site Assessment Clean, Dry, Intact 09/27/21 1200  Lumen #1 Status Flushed;Blood return noted;Saline locked 09/27/21 1200  Lumen #2 Status Flushed;Blood return noted;Saline locked 09/27/21 1200  Lumen #3 Status Flushed;Blood return noted;Saline locked 09/27/21 1200  Dressing Type Transparent;Securing device 09/27/21 1200  Dressing Status Clean, Dry, Intact;Antimicrobial disc in place 09/27/21 1200  Dressing Change Due 10/04/21 09/27/21 1200       Stacie Glaze Horton 09/27/2021, 12:41 PM

## 2021-09-28 LAB — GLUCOSE, CAPILLARY
Glucose-Capillary: 138 mg/dL — ABNORMAL HIGH (ref 70–99)
Glucose-Capillary: 141 mg/dL — ABNORMAL HIGH (ref 70–99)
Glucose-Capillary: 158 mg/dL — ABNORMAL HIGH (ref 70–99)
Glucose-Capillary: 159 mg/dL — ABNORMAL HIGH (ref 70–99)

## 2021-09-28 LAB — POCT I-STAT 7, (LYTES, BLD GAS, ICA,H+H)
Acid-base deficit: 6 mmol/L — ABNORMAL HIGH (ref 0.0–2.0)
Bicarbonate: 18.8 mmol/L — ABNORMAL LOW (ref 20.0–28.0)
Calcium, Ion: 1.15 mmol/L (ref 1.15–1.40)
HCT: 22 % — ABNORMAL LOW (ref 39.0–52.0)
Hemoglobin: 7.5 g/dL — ABNORMAL LOW (ref 13.0–17.0)
O2 Saturation: 97 %
Patient temperature: 97.4
Potassium: 4.6 mmol/L (ref 3.5–5.1)
Sodium: 147 mmol/L — ABNORMAL HIGH (ref 135–145)
TCO2: 20 mmol/L — ABNORMAL LOW (ref 22–32)
pCO2 arterial: 31.9 mmHg — ABNORMAL LOW (ref 32–48)
pH, Arterial: 7.376 (ref 7.35–7.45)
pO2, Arterial: 92 mmHg (ref 83–108)

## 2021-09-28 LAB — COMPREHENSIVE METABOLIC PANEL
ALT: 69 U/L — ABNORMAL HIGH (ref 0–44)
AST: 55 U/L — ABNORMAL HIGH (ref 15–41)
Albumin: 1.8 g/dL — ABNORMAL LOW (ref 3.5–5.0)
Alkaline Phosphatase: 74 U/L (ref 38–126)
Anion gap: 9 (ref 5–15)
BUN: 102 mg/dL — ABNORMAL HIGH (ref 6–20)
CO2: 17 mmol/L — ABNORMAL LOW (ref 22–32)
Calcium: 7.6 mg/dL — ABNORMAL LOW (ref 8.9–10.3)
Chloride: 117 mmol/L — ABNORMAL HIGH (ref 98–111)
Creatinine, Ser: 3.54 mg/dL — ABNORMAL HIGH (ref 0.61–1.24)
GFR, Estimated: 19 mL/min — ABNORMAL LOW (ref >60)
Glucose, Bld: 148 mg/dL — ABNORMAL HIGH (ref 70–99)
Potassium: 4.6 mmol/L (ref 3.5–5.1)
Sodium: 143 mmol/L (ref 135–145)
Total Bilirubin: 2.7 mg/dL — ABNORMAL HIGH (ref 0.3–1.2)
Total Protein: 5.8 g/dL — ABNORMAL LOW (ref 6.5–8.1)

## 2021-09-28 LAB — MAGNESIUM: Magnesium: 2.6 mg/dL — ABNORMAL HIGH (ref 1.7–2.4)

## 2021-09-28 LAB — PHOSPHORUS: Phosphorus: 3.1 mg/dL (ref 2.5–4.6)

## 2021-09-28 LAB — TRIGLYCERIDES: Triglycerides: 175 mg/dL — ABNORMAL HIGH (ref <150)

## 2021-09-28 MED ORDER — TRAVASOL 10 % IV SOLN
INTRAVENOUS | Status: AC
  Filled 2021-09-28: qty 1132.8

## 2021-09-28 MED ORDER — HEPARIN SODIUM (PORCINE) 5000 UNIT/ML IJ SOLN
5000.0000 [IU] | Freq: Three times a day (TID) | INTRAMUSCULAR | Status: DC
  Administered 2021-09-28 – 2021-10-02 (×11): 5000 [IU] via SUBCUTANEOUS
  Filled 2021-09-28 (×11): qty 1

## 2021-09-28 MED ORDER — NOREPINEPHRINE 4 MG/250ML-% IV SOLN
0.0000 ug/min | INTRAVENOUS | Status: DC
  Administered 2021-09-28: 2 ug/min via INTRAVENOUS
  Filled 2021-09-28: qty 250

## 2021-09-28 MED ORDER — FENTANYL CITRATE (PF) 2500 MCG/50ML IJ SOLN
50.0000 ug/h | Status: DC
  Administered 2021-09-28: 150 ug/h via INTRAVENOUS
  Administered 2021-09-29 – 2021-10-01 (×4): 200 ug/h via INTRAVENOUS
  Filled 2021-09-28 (×8): qty 100
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
## 2021-09-29 ENCOUNTER — Inpatient Hospital Stay (HOSPITAL_COMMUNITY): Payer: Non-veteran care

## 2021-09-29 LAB — GLUCOSE, CAPILLARY
Glucose-Capillary: 139 mg/dL — ABNORMAL HIGH (ref 70–99)
Glucose-Capillary: 157 mg/dL — ABNORMAL HIGH (ref 70–99)
Glucose-Capillary: 148 mg/dL — ABNORMAL HIGH (ref 70–99)
Glucose-Capillary: 137 mg/dL — ABNORMAL HIGH (ref 70–99)

## 2021-09-29 LAB — TYPE AND SCREEN
ABO/RH(D): B POS
Antibody Screen: NEGATIVE
Unit division: 0
Unit division: 0

## 2021-09-29 LAB — COMPREHENSIVE METABOLIC PANEL
ALT: 55 U/L — ABNORMAL HIGH (ref 0–44)
AST: 48 U/L — ABNORMAL HIGH (ref 15–41)
Albumin: 1.6 g/dL — ABNORMAL LOW (ref 3.5–5.0)
Alkaline Phosphatase: 73 U/L (ref 38–126)
Anion gap: 7 (ref 5–15)
BUN: 104 mg/dL — ABNORMAL HIGH (ref 6–20)
CO2: 19 mmol/L — ABNORMAL LOW (ref 22–32)
Calcium: 7.9 mg/dL — ABNORMAL LOW (ref 8.9–10.3)
Chloride: 123 mmol/L — ABNORMAL HIGH (ref 98–111)
Creatinine, Ser: 3.5 mg/dL — ABNORMAL HIGH (ref 0.61–1.24)
GFR, Estimated: 19 mL/min — ABNORMAL LOW (ref >60)
Glucose, Bld: 170 mg/dL — ABNORMAL HIGH (ref 70–99)
Potassium: 4.8 mmol/L (ref 3.5–5.1)
Sodium: 149 mmol/L — ABNORMAL HIGH (ref 135–145)
Total Bilirubin: 2.2 mg/dL — ABNORMAL HIGH (ref 0.3–1.2)
Total Protein: 6.6 g/dL (ref 6.5–8.1)

## 2021-09-29 LAB — CBC
HCT: 26.8 % — ABNORMAL LOW (ref 39.0–52.0)
Hemoglobin: 8.5 g/dL — ABNORMAL LOW (ref 13.0–17.0)
MCH: 30.4 pg (ref 26.0–34.0)
MCHC: 31.7 g/dL (ref 30.0–36.0)
MCV: 95.7 fL (ref 80.0–100.0)
Platelets: 446 10*3/uL — ABNORMAL HIGH (ref 150–400)
RBC: 2.8 MIL/uL — ABNORMAL LOW (ref 4.22–5.81)
RDW: 16.1 % — ABNORMAL HIGH (ref 11.5–15.5)
WBC: 44 10*3/uL — ABNORMAL HIGH (ref 4.0–10.5)
nRBC: 1.3 % — ABNORMAL HIGH (ref 0.0–0.2)

## 2021-09-29 LAB — CULTURE, RESPIRATORY W GRAM STAIN

## 2021-09-29 LAB — PHOSPHORUS: Phosphorus: 3.9 mg/dL (ref 2.5–4.6)

## 2021-09-29 LAB — POCT I-STAT 7, (LYTES, BLD GAS, ICA,H+H)
Acid-base deficit: 6 mmol/L — ABNORMAL HIGH (ref 0.0–2.0)
Bicarbonate: 19 mmol/L — ABNORMAL LOW (ref 20.0–28.0)
Calcium, Ion: 1.19 mmol/L (ref 1.15–1.40)
HCT: 26 % — ABNORMAL LOW (ref 39.0–52.0)
Hemoglobin: 8.8 g/dL — ABNORMAL LOW (ref 13.0–17.0)
O2 Saturation: 94 %
Patient temperature: 98.8
Potassium: 4.7 mmol/L (ref 3.5–5.1)
Sodium: 151 mmol/L — ABNORMAL HIGH (ref 135–145)
TCO2: 20 mmol/L — ABNORMAL LOW (ref 22–32)
pCO2 arterial: 36.4 mmHg (ref 32–48)
pH, Arterial: 7.327 — ABNORMAL LOW (ref 7.35–7.45)
pO2, Arterial: 78 mmHg — ABNORMAL LOW (ref 83–108)

## 2021-09-29 LAB — BPAM RBC
Blood Product Expiration Date: 202306092359
Blood Product Expiration Date: 202306092359
Unit Type and Rh: 7300
Unit Type and Rh: 7300

## 2021-09-29 LAB — MAGNESIUM: Magnesium: 2.8 mg/dL — ABNORMAL HIGH (ref 1.7–2.4)

## 2021-09-29 MED ORDER — TRAVASOL 10 % IV SOLN
INTRAVENOUS | Status: AC
  Filled 2021-09-29: qty 1416

## 2021-09-29 MED ORDER — SODIUM CHLORIDE 0.9 % IV SOLN
2.0000 g | Freq: Two times a day (BID) | INTRAVENOUS | Status: DC
  Administered 2021-09-29 – 2021-10-01 (×6): 2 g via INTRAVENOUS
  Filled 2021-09-29 (×6): qty 12.5

## 2021-09-29 MED ORDER — IOHEXOL 300 MG/ML  SOLN
100.0000 mL | Freq: Once | INTRAMUSCULAR | Status: AC | PRN
  Administered 2021-09-29: 100 mL
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
## 2021-09-29 NOTE — Progress Notes (Signed)
RT transported patient to xray and back with RN. No complications. RT will continue to monitor.

## 2021-09-29 NOTE — ED Notes (Signed)
Trauma Event Note   Assisted primary RN, Marissa and RT in transporting patient to fluro for UGI.  No complications.  Pt returned to room 4N17.  Pt still on minimal amount of levo and is sedated.  Spoke with Dr. Derrell Lolling post study.  OK to leave TPN for now.  Will probably start tube feeds tomorrow.  Last imported Vital Signs BP (!) 125/108   Pulse 77   Temp 98.4 F (36.9 C) (Axillary)   Resp 20   Ht 6' (1.829 m)   Wt 238 lb 1.6 oz (108 kg)   SpO2 96%   BMI 32.29 kg/m   Trending CBC Recent Labs    09/27/21 0408 09/27/21 0903 09/28/21 1235 09/29/21 0345 09/29/21 0427  WBC 32.6*  --   --  44.0*  --   HGB 7.8*   < > 7.5* 8.5* 8.8*  HCT 23.5*   < > 22.0* 26.8* 26.0*  PLT 297  --   --  446*  --    < > = values in this interval not displayed.    Trending Coag's No results for input(s): APTT, INR in the last 72 hours.  Trending BMET Recent Labs    09/27/21 0408 09/27/21 0903 09/28/21 0413 09/28/21 1235 09/29/21 0345 09/29/21 0427  NA 143   < > 143 147* 149* 151*  K 4.7   < > 4.6 4.6 4.8 4.7  CL 117*  --  117*  --  123*  --   CO2 20*  --  17*  --  19*  --   BUN 83*  --  102*  --  104*  --   CREATININE 2.85*  --  3.54*  --  3.50*  --   GLUCOSE 110*  --  148*  --  170*  --    < > = values in this interval not displayed.     Janora Norlander  Trauma Response RN  Please call TRN at [phone] for further assistance.
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
## 2021-09-30 ENCOUNTER — Inpatient Hospital Stay (HOSPITAL_COMMUNITY): Payer: Non-veteran care

## 2021-09-30 DIAGNOSIS — J9601 Acute respiratory failure with hypoxia: Secondary | ICD-10-CM

## 2021-09-30 DIAGNOSIS — W3400XA Accidental discharge from unspecified firearms or gun, initial encounter: Secondary | ICD-10-CM

## 2021-09-30 LAB — COMPREHENSIVE METABOLIC PANEL
ALT: 45 U/L — ABNORMAL HIGH (ref 0–44)
AST: 34 U/L (ref 15–41)
Albumin: 1.7 g/dL — ABNORMAL LOW (ref 3.5–5.0)
Alkaline Phosphatase: 168 U/L — ABNORMAL HIGH (ref 38–126)
Anion gap: 6 (ref 5–15)
BUN: 106 mg/dL — ABNORMAL HIGH (ref 6–20)
CO2: 17 mmol/L — ABNORMAL LOW (ref 22–32)
Calcium: 8 mg/dL — ABNORMAL LOW (ref 8.9–10.3)
Chloride: 128 mmol/L — ABNORMAL HIGH (ref 98–111)
Creatinine, Ser: 3.39 mg/dL — ABNORMAL HIGH (ref 0.61–1.24)
GFR, Estimated: 20 mL/min — ABNORMAL LOW (ref >60)
Glucose, Bld: 169 mg/dL — ABNORMAL HIGH (ref 70–99)
Potassium: 4.7 mmol/L (ref 3.5–5.1)
Sodium: 151 mmol/L — ABNORMAL HIGH (ref 135–145)
Total Bilirubin: 2.1 mg/dL — ABNORMAL HIGH (ref 0.3–1.2)
Total Protein: 6 g/dL — ABNORMAL LOW (ref 6.5–8.1)

## 2021-09-30 LAB — POCT I-STAT 7, (LYTES, BLD GAS, ICA,H+H)
Acid-base deficit: 9 mmol/L — ABNORMAL HIGH (ref 0.0–2.0)
Bicarbonate: 16 mmol/L — ABNORMAL LOW (ref 20.0–28.0)
Calcium, Ion: 1.24 mmol/L (ref 1.15–1.40)
HCT: 25 % — ABNORMAL LOW (ref 39.0–52.0)
Hemoglobin: 8.5 g/dL — ABNORMAL LOW (ref 13.0–17.0)
O2 Saturation: 94 %
Patient temperature: 99.4
Potassium: 4.7 mmol/L (ref 3.5–5.1)
Sodium: 154 mmol/L — ABNORMAL HIGH (ref 135–145)
TCO2: 17 mmol/L — ABNORMAL LOW (ref 22–32)
pCO2 arterial: 29.3 mmHg — ABNORMAL LOW (ref 32–48)
pH, Arterial: 7.347 — ABNORMAL LOW (ref 7.35–7.45)
pO2, Arterial: 77 mmHg — ABNORMAL LOW (ref 83–108)

## 2021-09-30 LAB — CBC
HCT: 28 % — ABNORMAL LOW (ref 39.0–52.0)
Hemoglobin: 8.9 g/dL — ABNORMAL LOW (ref 13.0–17.0)
MCH: 30.5 pg (ref 26.0–34.0)
MCHC: 31.8 g/dL (ref 30.0–36.0)
MCV: 95.9 fL (ref 80.0–100.0)
Platelets: 570 10*3/uL — ABNORMAL HIGH (ref 150–400)
RBC: 2.92 MIL/uL — ABNORMAL LOW (ref 4.22–5.81)
RDW: 16.5 % — ABNORMAL HIGH (ref 11.5–15.5)
WBC: 41.6 10*3/uL — ABNORMAL HIGH (ref 4.0–10.5)
nRBC: 1.3 % — ABNORMAL HIGH (ref 0.0–0.2)

## 2021-09-30 LAB — GLUCOSE, CAPILLARY
Glucose-Capillary: 153 mg/dL — ABNORMAL HIGH (ref 70–99)
Glucose-Capillary: 165 mg/dL — ABNORMAL HIGH (ref 70–99)
Glucose-Capillary: 161 mg/dL — ABNORMAL HIGH (ref 70–99)
Glucose-Capillary: 161 mg/dL — ABNORMAL HIGH (ref 70–99)

## 2021-09-30 LAB — TRIGLYCERIDES
Triglycerides: 128 mg/dL (ref <150)
Triglycerides: 127 mg/dL (ref <150)

## 2021-09-30 LAB — PHOSPHORUS: Phosphorus: 3.5 mg/dL (ref 2.5–4.6)

## 2021-09-30 LAB — MAGNESIUM: Magnesium: 2.5 mg/dL — ABNORMAL HIGH (ref 1.7–2.4)

## 2021-09-30 MED ORDER — ROCURONIUM BROMIDE 10 MG/ML (PF) SYRINGE
100.0000 mg | PREFILLED_SYRINGE | Freq: Once | INTRAVENOUS | Status: AC
  Administered 2021-09-30: 100 mg via INTRAVENOUS

## 2021-09-30 MED ORDER — TRAVASOL 10 % IV SOLN
INTRAVENOUS | Status: AC
  Filled 2021-09-30: qty 1416

## 2021-09-30 MED ORDER — QUETIAPINE FUMARATE 25 MG PO TABS
50.0000 mg | ORAL_TABLET | Freq: Two times a day (BID) | ORAL | Status: DC
  Administered 2021-09-30 – 2021-10-01 (×4): 50 mg
  Filled 2021-09-30 (×4): qty 2

## 2021-09-30 MED ORDER — ALBUMIN HUMAN 5 % IV SOLN
12.5000 g | Freq: Once | INTRAVENOUS | Status: AC
Start: 2021-09-30 — End: 2021-09-30
  Administered 2021-09-30: 12.5 g via INTRAVENOUS
  Filled 2021-09-30: qty 250

## 2021-09-30 MED ORDER — SODIUM ZIRCONIUM CYCLOSILICATE 5 G PO PACK
5.0000 g | PACK | Freq: Once | ORAL | Status: AC
  Administered 2021-09-30: 5 g
  Filled 2021-09-30: qty 1

## 2021-09-30 MED ORDER — NOREPINEPHRINE 4 MG/250ML-% IV SOLN
0.0000 ug/min | INTRAVENOUS | Status: DC
  Administered 2021-09-30: 8 ug/min via INTRAVENOUS
  Administered 2021-10-01: 32 ug/min via INTRAVENOUS
  Administered 2021-10-01: 31 ug/min via INTRAVENOUS
  Administered 2021-10-01: 30 ug/min via INTRAVENOUS
  Administered 2021-10-01: 32 ug/min via INTRAVENOUS
  Administered 2021-10-01 (×2): 31 ug/min via INTRAVENOUS
  Administered 2021-10-01: 28 ug/min via INTRAVENOUS
  Filled 2021-09-30 (×9): qty 250

## 2021-09-30 MED ORDER — PIVOT 1.5 CAL PO LIQD
1000.0000 mL | ORAL | Status: DC
Start: 2021-09-30 — End: 2021-10-02
  Administered 2021-09-30 – 2021-10-01 (×2): 1000 mL

## 2021-09-30 MED ORDER — FREE WATER
200.0000 mL | Freq: Four times a day (QID) | Status: DC
Start: 2021-09-30 — End: 2021-10-02
  Administered 2021-09-30 – 2021-10-02 (×8): 200 mL

## 2021-09-30 MED ORDER — LACTATED RINGERS IV BOLUS
1000.0000 mL | Freq: Once | INTRAVENOUS | Status: AC
Start: 2021-09-30 — End: 2021-09-30
  Administered 2021-09-30: 1000 mL via INTRAVENOUS

## 2021-09-30 MED ORDER — ETOMIDATE 2 MG/ML IV SOLN
20.0000 mg | Freq: Once | INTRAVENOUS | Status: AC
  Administered 2021-09-30: 20 mg via INTRAVENOUS

## 2021-09-30 MED ORDER — ETOMIDATE 2 MG/ML IV SOLN
INTRAVENOUS | Status: AC
  Administered 2021-09-30: 20 mg
  Filled 2021-09-30: qty 10

## 2021-09-30 MED ORDER — VITAL HIGH PROTEIN PO LIQD: 1000.0000 mL | ORAL | Status: DC

## 2021-09-30 MED ORDER — ROCURONIUM BROMIDE 10 MG/ML (PF) SYRINGE
PREFILLED_SYRINGE | INTRAVENOUS | Status: AC
  Administered 2021-09-30: 100 mg
  Filled 2021-09-30: qty 10

## 2021-09-30 MED ORDER — ACETAMINOPHEN 500 MG PO TABS
1000.0000 mg | ORAL_TABLET | Freq: Four times a day (QID) | ORAL | Status: DC
  Administered 2021-09-30 – 2021-10-02 (×8): 1000 mg
  Filled 2021-09-30 (×8): qty 2
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
## 2021-09-30 NOTE — Procedures (Signed)
Intubation Procedure Note  Ethan Moore  [id number]  [birth year]  Date:09/30/21  Time:12:13 PM   Provider Performing:Rylah Fukuda    Procedure: Intubation (31500)  Indication(s) Respiratory Failure  Consent Risks of the procedure as well as the alternatives and risks of each were explained to the patient and/or caregiver.  Consent for the procedure was obtained and is signed in the bedside chart   Anesthesia Etomidate and Rocuronium   Time Out Verified patient identification, verified procedure, site/side was marked, verified correct patient position, special equipment/implants available, medications/allergies/relevant history reviewed, required imaging and test results available.   Sterile Technique Usual hand hygeine, masks, and gloves were used   Procedure Description Patient positioned in bed supine.  Sedation given as noted above.  Patient was intubated with endotracheal tube using  MAC 4 .  View was Grade 2 only posterior commissure .  Number of attempts was 1.  Colorimetric CO2 detector was consistent with tracheal placement.   Complications/Tolerance None; patient tolerated the procedure well. Chest X-ray is ordered to verify placement.   EBL Minimal   Specimen(s) None

## 2021-09-30 NOTE — Consult Note (Signed)
WOC Nurse wound follow up Patient receiving care in [facility] 4N17. Patient intubated, sedated. Wound type: Midline surgical wound to abdomen Measurement: measured last week on Friday. Wound bed: pink with scattered adipose tissue Drainage (amount, consistency, odor) dark serosanginous in VAC cannister Periwound: intact Dressing procedure/placement/frequency: All black foam removed from wound bed. One piece of black foam placed into wound, drape applied, immediate seal obtained.  Due to the straightforward nature of the dressing change, I have modified to order to reflect that the bedside nurse is perform on MWF beginning 5/31.  Helmut Muster, RN, MSN, CWOCN, CNS-BC, pager [phone]  Conservative sharp wound debridement (CSWD performed at the bedside):

## 2021-09-30 NOTE — Progress Notes (Addendum)
  Transition of Care (TOC) Screening Note   Patient Details  Name: LEMARION WALSINGHAM Date of Birth: April 09, 1961   Transition of Care [facility]) CM/SW Contact:    Shelah Heatley C Tarpley-Carter, Beatrice Phone Number: 09/30/2021, 9:58 AM    Transition of Care Department [facility]) has reviewed patient and no TOC needs have been identified at this time. We will continue to monitor patient advancement through interdisciplinary progression rounds.   Mylei Brackeen Tarpley-Carter, MSW, LCSW-A Pronouns:  She/Her/Hers [facility] Health[facility]itions of Care Clinical Social Worker Direct Number:  [phone] [email or web address]

## 2021-10-01 DIAGNOSIS — Z515 Encounter for palliative care: Secondary | ICD-10-CM

## 2021-10-01 DIAGNOSIS — Z7189 Other specified counseling: Secondary | ICD-10-CM

## 2021-10-01 LAB — POCT I-STAT 7, (LYTES, BLD GAS, ICA,H+H)
Acid-base deficit: 11 mmol/L — ABNORMAL HIGH (ref 0.0–2.0)
Bicarbonate: 13.2 mmol/L — ABNORMAL LOW (ref 20.0–28.0)
Calcium, Ion: 1.21 mmol/L (ref 1.15–1.40)
HCT: 26 % — ABNORMAL LOW (ref 39.0–52.0)
Hemoglobin: 8.8 g/dL — ABNORMAL LOW (ref 13.0–17.0)
O2 Saturation: 97 %
Patient temperature: 99.3
Potassium: 5.1 mmol/L (ref 3.5–5.1)
Sodium: 150 mmol/L — ABNORMAL HIGH (ref 135–145)
TCO2: 14 mmol/L — ABNORMAL LOW (ref 22–32)
pCO2 arterial: 24.6 mmHg — ABNORMAL LOW (ref 32–48)
pH, Arterial: 7.34 — ABNORMAL LOW (ref 7.35–7.45)
pO2, Arterial: 97 mmHg (ref 83–108)

## 2021-10-01 LAB — GLUCOSE, CAPILLARY
Glucose-Capillary: 240 mg/dL — ABNORMAL HIGH (ref 70–99)
Glucose-Capillary: 240 mg/dL — ABNORMAL HIGH (ref 70–99)
Glucose-Capillary: 232 mg/dL — ABNORMAL HIGH (ref 70–99)
Glucose-Capillary: 219 mg/dL — ABNORMAL HIGH (ref 70–99)

## 2021-10-01 LAB — CBC
HCT: 27.8 % — ABNORMAL LOW (ref 39.0–52.0)
Hemoglobin: 8.7 g/dL — ABNORMAL LOW (ref 13.0–17.0)
MCH: 30.3 pg (ref 26.0–34.0)
MCHC: 31.3 g/dL (ref 30.0–36.0)
MCV: 96.9 fL (ref 80.0–100.0)
Platelets: 669 10*3/uL — ABNORMAL HIGH (ref 150–400)
RBC: 2.87 MIL/uL — ABNORMAL LOW (ref 4.22–5.81)
RDW: 16.9 % — ABNORMAL HIGH (ref 11.5–15.5)
WBC: 49.7 10*3/uL — ABNORMAL HIGH (ref 4.0–10.5)
nRBC: 0.9 % — ABNORMAL HIGH (ref 0.0–0.2)

## 2021-10-01 LAB — BASIC METABOLIC PANEL
Anion gap: 8 (ref 5–15)
BUN: 112 mg/dL — ABNORMAL HIGH (ref 6–20)
CO2: 13 mmol/L — ABNORMAL LOW (ref 22–32)
Calcium: 7.8 mg/dL — ABNORMAL LOW (ref 8.9–10.3)
Chloride: 127 mmol/L — ABNORMAL HIGH (ref 98–111)
Creatinine, Ser: 3.7 mg/dL — ABNORMAL HIGH (ref 0.61–1.24)
GFR, Estimated: 18 mL/min — ABNORMAL LOW (ref >60)
Glucose, Bld: 252 mg/dL — ABNORMAL HIGH (ref 70–99)
Potassium: 5 mmol/L (ref 3.5–5.1)
Sodium: 148 mmol/L — ABNORMAL HIGH (ref 135–145)

## 2021-10-01 MED ORDER — GLYCOPYRROLATE 0.2 MG/ML IJ SOLN
0.6000 mg | INTRAMUSCULAR | Status: DC
  Administered 2021-10-01 – 2021-10-02 (×4): 0.6 mg via INTRAVENOUS
  Filled 2021-10-01 (×4): qty 3

## 2021-10-01 MED ORDER — TRAVASOL 10 % IV SOLN
INTRAVENOUS | Status: DC
  Filled 2021-10-01: qty 1416

## 2021-10-01 MED ORDER — SODIUM ZIRCONIUM CYCLOSILICATE 10 G PO PACK
10.0000 g | PACK | Freq: Once | ORAL | Status: AC
  Administered 2021-10-01: 10 g
  Filled 2021-10-01: qty 1

## 2021-10-01 MED ORDER — PHENYLEPHRINE HCL-NACL 20-0.9 MG/250ML-% IV SOLN
0.0000 ug/min | INTRAVENOUS | Status: DC
  Administered 2021-10-01: 20 ug/min via INTRAVENOUS
  Administered 2021-10-02: 70 ug/min via INTRAVENOUS
  Filled 2021-10-01: qty 250

## 2021-10-01 MED ORDER — SODIUM BICARBONATE 8.4 % IV SOLN
50.0000 meq | Freq: Once | INTRAVENOUS | Status: AC
  Administered 2021-10-01: 50 meq via INTRAVENOUS
  Filled 2021-10-01: qty 50

## 2021-10-01 MED ORDER — PHENYLEPHRINE HCL-NACL 20-0.9 MG/250ML-% IV SOLN
INTRAVENOUS | Status: AC
  Administered 2021-10-01: 20 ug/min via INTRAVENOUS
  Filled 2021-10-01: qty 250

## 2021-10-01 MED ORDER — INSULIN ASPART 100 UNIT/ML IJ SOLN
0.0000 [IU] | INTRAMUSCULAR | Status: DC
  Administered 2021-10-01 (×3): 3 [IU] via SUBCUTANEOUS
  Administered 2021-10-02: 2 [IU] via SUBCUTANEOUS

## 2021-10-01 MED ORDER — ALBUMIN HUMAN 5 % IV SOLN
25.0000 g | Freq: Once | INTRAVENOUS | Status: AC
  Administered 2021-10-01: 12.5 g via INTRAVENOUS
  Filled 2021-10-01: qty 500

## 2021-10-01 MED ORDER — NOREPINEPHRINE 16 MG/250ML-% IV SOLN
0.0000 ug/min | INTRAVENOUS | Status: DC
  Administered 2021-10-02 (×3): 40 ug/min via INTRAVENOUS
  Filled 2021-10-01 (×2): qty 250
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
## 2021-10-01 NOTE — TOC CAGE-AID Note (Signed)
Transition of Care [facility]) - CAGE-AID Screening   Patient Details  Name: Ethan Moore MRN: [id number] Date of Birth: [birth year]  Transition of Care [facility]) CM/SW Contact:    Thayden Lemire C Tarpley-Carter, LCSWA Phone Number: 10/01/2021, 9:01 AM   Clinical Narrative: Pt is unable to participate in Cage Aid. Pt is critically ill.    Ved Martos Tarpley-Carter, MSW, LCSW-A Pronouns:  She/Her/Hers [facility]itions of [facility] Clinical Social Worker Direct Number:  [phone] [email or web address]  CAGE-AID Screening: Substance Abuse Screening unable to be completed due to: : Patient unable to participate

## 2021-10-01 NOTE — Progress Notes (Signed)
Patient ID: Ethan Moore, male   DOB: Nov 13, 1960, 61 y.o.   MRN: [id number] Family decided to make him DNR.  Violeta Gelinas, MD, MPH, FACS Please use [email or web address] to contact on call provider
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
## 2021-10-01 NOTE — Progress Notes (Signed)
Patients Son and Daughter talked with Harrison Mons from [place] regarding organ donation. After a lot of thought and consideration the family declined donation. I also reached out to the ME and notified them of the patient transitioning to comfort with extubation at 1000 tomorrow morning. The family were very understanding and all question and concerns were addressed.
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
## 2021-10-01 NOTE — Progress Notes (Signed)
CPT held at this time d/t BP issues.  RT will assess again later.

## 2021-10-01 NOTE — Progress Notes (Signed)
[place] was notified reference # [id number]

## 2021-10-02 ENCOUNTER — Encounter (HOSPITAL_COMMUNITY): Payer: Self-pay | Admitting: Internal Medicine

## 2021-10-02 LAB — GLUCOSE, CAPILLARY: Glucose-Capillary: 174 mg/dL — ABNORMAL HIGH (ref 70–99)

## 2021-10-02 MED ORDER — ACETAMINOPHEN 650 MG RE SUPP: 650.0000 mg | Freq: Four times a day (QID) | RECTAL | Status: DC | PRN

## 2021-10-02 MED ORDER — POLYVINYL ALCOHOL 1.4 % OP SOLN: 1.0000 [drp] | Freq: Four times a day (QID) | OPHTHALMIC | Status: DC | PRN

## 2021-10-02 MED ORDER — ACETAMINOPHEN 325 MG PO TABS: 650.0000 mg | ORAL_TABLET | Freq: Four times a day (QID) | ORAL | Status: DC | PRN

## 2021-10-02 MED ORDER — BIOTENE DRY MOUTH MT LIQD: 15.0000 mL | OROMUCOSAL | Status: DC | PRN

## 2021-10-02 MED ORDER — MIDAZOLAM BOLUS VIA INFUSION
2.0000 mg | INTRAVENOUS | Status: DC | PRN
  Administered 2021-10-02: 4 mg via INTRAVENOUS

## 2021-10-02 MED ORDER — DIPHENHYDRAMINE HCL 50 MG/ML IJ SOLN: 25.0000 mg | INTRAMUSCULAR | Status: DC | PRN

## 2021-10-03 NOTE — Progress Notes (Signed)
Patient was pronounced at 1121 by I Emogene Morgan RN and Scarlette Slice RN. Family is at the bedside along with chaplin. Support and words of comfort were given to the family. Will continue to support the family.
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
## 2021-10-03 NOTE — Progress Notes (Signed)
Patient extubated per MD order per family's wishes.  Patient tolerated well.  No complications.

## 2021-10-03 NOTE — Progress Notes (Signed)
This chaplain responded to RN-Kimberly's page for EOL spiritual care. The chaplain understands the Pt. is actively dying with family at the bedside requesting prayer.   The RN updated the chaplain before the visit. The chaplain greeted the family and invited the family to join the chaplain in prayer.   The chaplain offered F/U spiritual care and witnessed family leaning on each other at the time of the Pt. death.  Chaplain Stephanie Acre [phone]

## 2021-10-03 NOTE — Death Summary Note (Signed)
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 0345 09/30/21 0404  AST 55* 48* 34  ALT 69* 55* 45*  ALKPHOS 74 73 168*  BILITOT 2.7* 2.2* 2.1*  PROT 5.8* 6.6 6.0*  ALBUMIN 1.8* 1.6* 1.7*   No results for input(s): LIPASE, AMYLASE in the last 168 hours. No results for input(s): AMMONIA in the last 168 hours. CBC: Recent Labs  Lab 09/26/21 0556 09/27/21 0408 09/27/21 0903 09/29/21 0345 09/29/21 0427 09/30/21 0404 09/30/21 0849 10/01/21 0426 10/01/21 0519  WBC 28.3* 32.6*  --  44.0*  --  41.6*  --   --  49.7*  HGB 9.0* 7.8*   < > 8.5* 8.8* 8.9* 8.5* 8.8* 8.7*  HCT 27.6* 23.5*   < > 26.8* 26.0* 28.0* 25.0* 26.0* 27.8*  MCV 92.3 91.8  --  95.7  --  95.9  --   --  96.9  PLT 269 297  --  446*  --  570*  --   --  669*   < > = values in this interval not displayed.   Cardiac Enzymes: No results for input(s): CKTOTAL, CKMB, CKMBINDEX, TROPONINI in the last 168 hours. Sepsis Labs: Recent Labs  Lab 09/27/21 0408 09/29/21 0345 09/30/21 0404 10/01/21 0519  WBC 32.6* 44.0* 41.6* 49.7*    Procedures/Operations  exlap, splenectomy  gastric repair and repair of jejunal contusion    Glorianna Gott N Magdala Brahmbhatt 09/30/2021, 2:00 PM

## 2021-10-03 NOTE — Progress Notes (Signed)
RT NOTE: holding CPT this AM due to patient's HR of 117-120.  RT was able to suction a copious amount of yellow secretions from patient.  Tolerating current ventilator settings well at this time.  Will continue to monitor.

## 2021-10-03 DEATH — deceased
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
